# Patient Record
Sex: Male | Born: 1990 | Race: Black or African American | Hispanic: No | Marital: Single | Smoking: Former smoker
Health system: Southern US, Community
[De-identification: ages and names within clinical notes are randomized; demographics above are authoritative.]

## PROBLEM LIST (undated history)

## (undated) ENCOUNTER — Ambulatory Visit: Admission: EM | Payer: Medicaid Other | Source: Home / Self Care

## (undated) DIAGNOSIS — R011 Cardiac murmur, unspecified: Secondary | ICD-10-CM

## (undated) DIAGNOSIS — G43909 Migraine, unspecified, not intractable, without status migrainosus: Secondary | ICD-10-CM

---
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
## 2012-10-24 ENCOUNTER — Emergency Department (HOSPITAL_COMMUNITY): Payer: No Typology Code available for payment source

## 2012-10-24 ENCOUNTER — Emergency Department (HOSPITAL_COMMUNITY)
Admission: EM | Admit: 2012-10-24 | Discharge: 2012-10-25 | Disposition: A | Discharge status: Home / Self Care | Payer: No Typology Code available for payment source | Attending: Emergency Medicine | Admitting: Emergency Medicine

## 2012-10-24 ENCOUNTER — Encounter (HOSPITAL_COMMUNITY): Payer: Self-pay | Admitting: *Deleted

## 2012-10-24 DIAGNOSIS — S298XXA Other specified injuries of thorax, initial encounter: Secondary | ICD-10-CM | POA: Insufficient documentation

## 2012-10-24 DIAGNOSIS — Y92410 Unspecified street and highway as the place of occurrence of the external cause: Secondary | ICD-10-CM | POA: Insufficient documentation

## 2012-10-24 DIAGNOSIS — Y9389 Activity, other specified: Secondary | ICD-10-CM | POA: Insufficient documentation

## 2012-10-24 DIAGNOSIS — R4182 Altered mental status, unspecified: Secondary | ICD-10-CM | POA: Insufficient documentation

## 2012-10-24 DIAGNOSIS — S3981XA Other specified injuries of abdomen, initial encounter: Secondary | ICD-10-CM | POA: Insufficient documentation

## 2012-10-24 MED ORDER — HYDROMORPHONE HCL PF 1 MG/ML IJ SOLN
1.0000 mg | Freq: Once | INTRAMUSCULAR | Status: AC
  Administered 2012-10-24: 1 mg via INTRAVENOUS
  Filled 2012-10-24: qty 1

## 2012-10-24 MED ORDER — SODIUM CHLORIDE 0.9 % IV SOLN
INTRAVENOUS | Status: DC
  Administered 2012-10-24 – 2012-10-25 (×2): via INTRAVENOUS

## 2012-10-24 MED ORDER — ONDANSETRON HCL 4 MG/2ML IJ SOLN
4.0000 mg | Freq: Once | INTRAMUSCULAR | Status: AC
  Administered 2012-10-24: 4 mg via INTRAVENOUS
  Filled 2012-10-24: qty 2

## 2012-10-24 MED ORDER — IOHEXOL 300 MG/ML  SOLN
100.0000 mL | Freq: Once | INTRAMUSCULAR | Status: AC | PRN
  Administered 2012-10-24: 100 mL via INTRAVENOUS
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
## 2012-10-25 MED ORDER — IBUPROFEN 600 MG PO TABS: 600.0000 mg | ORAL_TABLET | Freq: Four times a day (QID) | ORAL | Status: DC | PRN

## 2012-10-25 MED ORDER — MORPHINE SULFATE 4 MG/ML IJ SOLN
6.0000 mg | Freq: Once | INTRAMUSCULAR | Status: AC
  Administered 2012-10-25: 6 mg via INTRAVENOUS
  Filled 2012-10-25: qty 2

## 2012-10-25 MED ORDER — HYDROCODONE-ACETAMINOPHEN 5-325 MG PO TABS: 1.0000 | ORAL_TABLET | ORAL | Status: DC | PRN

## 2012-10-25 MED ORDER — IBUPROFEN 800 MG PO TABS
800.0000 mg | ORAL_TABLET | Freq: Once | ORAL | Status: AC
  Administered 2012-10-25: 800 mg via ORAL
  Filled 2012-10-25: qty 1

## 2012-10-25 MED ORDER — CYCLOBENZAPRINE HCL 10 MG PO TABS: 10.0000 mg | ORAL_TABLET | Freq: Two times a day (BID) | ORAL | Status: DC | PRN

## 2012-10-25 MED ORDER — CYCLOBENZAPRINE HCL 10 MG PO TABS
10.0000 mg | ORAL_TABLET | Freq: Once | ORAL | Status: AC
  Administered 2012-10-25: 10 mg via ORAL
  Filled 2012-10-25: qty 1

## 2012-10-25 NOTE — ED Provider Notes (Signed)
Medical screening examination/treatment/procedure(s) were conducted as a shared visit with non-physician practitioner(s) and myself.  I personally evaluated the patient during the encounter. Patient very anxious with good tenderness but no crepitus and equal lung sounds. Patient evaluated with trauma scans reviewed. Pain improved with IV Dilaudid. Patient ambulates with assistance. His father is bedside and willing to help in the home. Prescriptions provided. Precautions provided. Patient stable for discharge home with outpatient referral. No indication for admission. Confusion resolved without altered mental status.    Jacob Nielsen, MD 10/25/12 [phone]

## 2014-01-16 ENCOUNTER — Emergency Department (INDEPENDENT_AMBULATORY_CARE_PROVIDER_SITE_OTHER)
Admission: EM | Admit: 2014-01-16 | Discharge: 2014-01-16 | Disposition: A | Discharge status: Home / Self Care | Payer: Self-pay | Source: Home / Self Care | Attending: Emergency Medicine | Admitting: Emergency Medicine

## 2014-01-16 ENCOUNTER — Encounter (HOSPITAL_COMMUNITY): Payer: Self-pay | Admitting: Emergency Medicine

## 2014-01-16 ENCOUNTER — Other Ambulatory Visit (HOSPITAL_COMMUNITY)
Admission: RE | Admit: 2014-01-16 | Discharge: 2014-01-16 | Disposition: A | Discharge status: Home / Self Care | Payer: Self-pay | Source: Ambulatory Visit | Attending: Emergency Medicine | Admitting: Emergency Medicine

## 2014-01-16 DIAGNOSIS — Z202 Contact with and (suspected) exposure to infections with a predominantly sexual mode of transmission: Secondary | ICD-10-CM

## 2014-01-16 DIAGNOSIS — Z113 Encounter for screening for infections with a predominantly sexual mode of transmission: Secondary | ICD-10-CM | POA: Insufficient documentation

## 2014-01-16 LAB — RPR: RPR Ser Ql: NONREACTIVE

## 2014-01-16 LAB — HIV ANTIBODY (ROUTINE TESTING W REFLEX): HIV: NONREACTIVE

## 2014-01-16 MED ORDER — CEFTRIAXONE SODIUM 250 MG IJ SOLR
INTRAMUSCULAR | Status: AC
  Filled 2014-01-16: qty 250

## 2014-01-16 MED ORDER — AZITHROMYCIN 250 MG PO TABS
ORAL_TABLET | ORAL | Status: AC
  Filled 2014-01-16: qty 4

## 2014-01-16 MED ORDER — CEFTRIAXONE SODIUM 250 MG IJ SOLR
250.0000 mg | Freq: Once | INTRAMUSCULAR | Status: AC
  Administered 2014-01-16: 250 mg via INTRAMUSCULAR

## 2014-01-16 MED ORDER — AZITHROMYCIN 250 MG PO TABS
1000.0000 mg | ORAL_TABLET | Freq: Once | ORAL | Status: AC
  Administered 2014-01-16: 1000 mg via ORAL

## 2014-01-16 NOTE — ED Notes (Signed)
PT DENYS  ANY  SYMPTOMS  HOWEVER       STATES  CONDOM  BURST     LAST  PM

## 2014-01-16 NOTE — ED Provider Notes (Signed)
CSN: [id number]     Arrival date & time 01/16/14  1104 History   First MD Initiated Contact with Patient 01/16/14 1204     Chief Complaint  Patient presents with  . Exposure to STD   (Consider location/radiation/quality/duration/timing/severity/associated sxs/prior Treatment) HPI Comments: Patient states he is concerned about possible STI exposure. States he had a condom tear during intercourse yesterday. Has been treated in the past for gonorrhea and chlamydia. Denies any symptoms currently.   Patient is a 23 y.o. male presenting with STD exposure. The history is provided by the patient.  Exposure to STD This is a new problem. The current episode started yesterday.    History reviewed. No pertinent past medical history. History reviewed. No pertinent past surgical history. History reviewed. No pertinent family history. History  Substance Use Topics  . Smoking status: Never Smoker   . Smokeless tobacco: Not on file  . Alcohol Use: No    Review of Systems  All other systems reviewed and are negative.    Allergies  Review of patient's allergies indicates no known allergies.  Home Medications   Current Outpatient Rx  Name  Route  Sig  Dispense  Refill  . cyclobenzaprine (FLEXERIL) 10 MG tablet   Oral   Take 1 tablet (10 mg total) by mouth 2 (two) times daily as needed for muscle spasms.   20 tablet   0   . HYDROcodone-acetaminophen (NORCO/VICODIN) 5-325 MG per tablet   Oral   Take 1 tablet by mouth every 4 (four) hours as needed for pain.   15 tablet   0   . ibuprofen (ADVIL,MOTRIN) 200 MG tablet   Oral   Take 200 mg by mouth every 6 (six) hours as needed. For headache or mild pain         . ibuprofen (ADVIL,MOTRIN) 600 MG tablet   Oral   Take 1 tablet (600 mg total) by mouth every 6 (six) hours as needed for pain.   30 tablet   0    There were no vitals taken for this visit. Physical Exam  Constitutional: He is oriented to person, place, and time. He  appears well-developed and well-nourished. No distress.  HENT:  Head: Normocephalic and atraumatic.  Eyes: Conjunctivae are normal. No scleral icterus.  Cardiovascular: Normal rate.   Pulmonary/Chest: Effort normal.  Abdominal: There is no tenderness.  Genitourinary:  Declines exam of genitalia  Musculoskeletal: Normal range of motion.  Neurological: He is alert and oriented to person, place, and time.  Skin: Skin is warm and dry.  Psychiatric: He has a normal mood and affect. His behavior is normal.    ED Course  Procedures (including critical care time) Labs Review Labs Reviewed  HIV ANTIBODY (ROUTINE TESTING)  RPR  URINE CYTOLOGY ANCILLARY ONLY   Imaging Review No results found.   MDM   1. Possible exposure to STD    [facility] and contact by phone if labs indicate need for additional treatment.   Jess BartersJennifer Lee AuroraPresson, [place] 01/16/14 1257

## 2014-01-16 NOTE — ED Provider Notes (Signed)
Medical screening examination/treatment/procedure(s) were performed by non-physician practitioner and as supervising physician I was immediately available for consultation/collaboration.  Leslee Homeavid Onalee Steinbach, M.D.  Reuben Likesavid C Rhythm Gubbels, MD 01/16/14 2120

## 2014-01-17 LAB — URINE CYTOLOGY ANCILLARY ONLY
Chlamydia: POSITIVE — AB
Neisseria Gonorrhea: NEGATIVE
TRICH (WINDOWPATH): NEGATIVE

## 2014-01-18 ENCOUNTER — Telehealth (HOSPITAL_COMMUNITY): Payer: Self-pay | Admitting: *Deleted

## 2014-01-18 NOTE — ED Notes (Signed)
GC/Trich neg. Chlamydia pos.,  HIV/RPR non-reactive.  Pt. adequately treated with Zithromax and Rocephin.  I called pt. Pt. left 2 other numbers to try and both were not working numbers on his VM, [phone] and [phone]. I left a message to call at his cell number. The home number was disconnected also.  Call 1. Jacob Johnston, Jacob Johnston 01/18/2014

## 2014-01-19 NOTE — ED Notes (Signed)
[facility].  Pt. verified x 2 and given results.  Pt. told he was adequately treated with the Zithromax.  Pt. instructed to notify his partner, no sex for 1 week and to practice safe sex. Pt. told he should get [facility], by appointment.  He said he did not wait 1 week.  I told him he should notify his partner and get rechecked if any further symptoms.  [facility] form completed [facility]. Desiree LucySuzanne M [facility] 01/19/2014

## 2014-02-28 ENCOUNTER — Encounter (HOSPITAL_COMMUNITY): Payer: Self-pay | Admitting: Emergency Medicine

## 2014-02-28 ENCOUNTER — Emergency Department (INDEPENDENT_AMBULATORY_CARE_PROVIDER_SITE_OTHER)
Admission: EM | Admit: 2014-02-28 | Discharge: 2014-02-28 | Disposition: A | Discharge status: Home / Self Care | Payer: Self-pay | Source: Home / Self Care | Attending: Family Medicine | Admitting: Family Medicine

## 2014-02-28 ENCOUNTER — Other Ambulatory Visit (HOSPITAL_COMMUNITY)
Admission: RE | Admit: 2014-02-28 | Discharge: 2014-02-28 | Disposition: A | Discharge status: Home / Self Care | Payer: Self-pay | Source: Ambulatory Visit | Attending: Family Medicine | Admitting: Family Medicine

## 2014-02-28 DIAGNOSIS — Z113 Encounter for screening for infections with a predominantly sexual mode of transmission: Secondary | ICD-10-CM

## 2014-02-28 NOTE — ED Notes (Signed)
HERE FOR STD CHECKING
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
## 2014-02-28 NOTE — Discharge Instructions (Signed)
We will call with positive test results and treat as indicated  °

## 2014-03-03 ENCOUNTER — Telehealth (HOSPITAL_COMMUNITY): Payer: Self-pay | Admitting: Family Medicine

## 2014-03-03 MED ORDER — AZITHROMYCIN 250 MG PO TABS: 1000.0000 mg | ORAL_TABLET | Freq: Once | ORAL | Status: DC

## 2014-03-03 NOTE — ED Notes (Signed)
Chlamydia positive. Azithromycin Prescriptions  sent to pharymacy  Rodolph BongEvan S Tawsha Terrero, MD 03/03/14 1536

## 2014-03-09 ENCOUNTER — Emergency Department (HOSPITAL_COMMUNITY): Payer: No Typology Code available for payment source

## 2014-03-09 ENCOUNTER — Emergency Department (HOSPITAL_COMMUNITY)
Admission: EM | Admit: 2014-03-09 | Discharge: 2014-03-09 | Disposition: A | Discharge status: Home / Self Care | Payer: No Typology Code available for payment source | Attending: Emergency Medicine | Admitting: Emergency Medicine

## 2014-03-09 ENCOUNTER — Encounter (HOSPITAL_COMMUNITY): Payer: Self-pay | Admitting: Emergency Medicine

## 2014-03-09 DIAGNOSIS — Z792 Long term (current) use of antibiotics: Secondary | ICD-10-CM | POA: Insufficient documentation

## 2014-03-09 DIAGNOSIS — Y9389 Activity, other specified: Secondary | ICD-10-CM | POA: Insufficient documentation

## 2014-03-09 DIAGNOSIS — Y92410 Unspecified street and highway as the place of occurrence of the external cause: Secondary | ICD-10-CM | POA: Insufficient documentation

## 2014-03-09 DIAGNOSIS — S161XXA Strain of muscle, fascia and tendon at neck level, initial encounter: Secondary | ICD-10-CM

## 2014-03-09 DIAGNOSIS — S139XXA Sprain of joints and ligaments of unspecified parts of neck, initial encounter: Secondary | ICD-10-CM | POA: Insufficient documentation

## 2014-03-09 DIAGNOSIS — IMO0002 Reserved for concepts with insufficient information to code with codable children: Secondary | ICD-10-CM | POA: Insufficient documentation

## 2014-03-09 MED ORDER — HYDROCODONE-ACETAMINOPHEN 5-325 MG PO TABS
1.0000 | ORAL_TABLET | Freq: Once | ORAL | Status: AC
  Administered 2014-03-09: 1 via ORAL
  Filled 2014-03-09: qty 1

## 2014-03-09 MED ORDER — METHOCARBAMOL 500 MG PO TABS
1000.0000 mg | ORAL_TABLET | Freq: Once | ORAL | Status: AC
  Administered 2014-03-09: 1000 mg via ORAL
  Filled 2014-03-09: qty 2

## 2014-03-09 MED ORDER — IBUPROFEN 800 MG PO TABS: 800.0000 mg | ORAL_TABLET | Freq: Three times a day (TID) | ORAL | Status: DC

## 2014-03-09 MED ORDER — HYDROCODONE-ACETAMINOPHEN 5-325 MG PO TABS: 2.0000 | ORAL_TABLET | ORAL | Status: DC | PRN

## 2014-03-09 MED ORDER — METHOCARBAMOL 500 MG PO TABS: 500.0000 mg | ORAL_TABLET | Freq: Two times a day (BID) | ORAL | Status: DC

## 2014-03-09 NOTE — Progress Notes (Signed)
[facility] provided pt with a list of primary care resources to help patient establish a pcp.  °

## 2014-03-09 NOTE — ED Notes (Signed)
Patient transported to X-ray 
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
## 2014-03-09 NOTE — ED Notes (Signed)
Bed: WA16 Expected date:  Expected time:  Means of arrival:  Comments: ems 

## 2014-03-09 NOTE — Discharge Instructions (Signed)
Cervical Sprain °A cervical sprain is when the tissues (ligaments) that hold the neck bones in place stretch or tear. °HOME CARE  °· Put ice on the injured area. °· Put ice in a plastic bag. °· Place a towel between your skin and the bag. °· Leave the ice on for 15 20 minutes, 3 4 times a day. °· You may have been given a collar to wear. This collar keeps your neck from moving while you heal. °· Do not take the collar off unless told by your doctor. °· If you have long hair, keep it outside of the collar. °· Ask your doctor before changing the position of your collar. You may need to change its position over time to make it more comfortable. °· If you are allowed to take off the collar for cleaning or bathing, follow your doctor's instructions on how to do it safely. °· Keep your collar clean by wiping it with mild soap and water. Dry it completely. If the collar has removable pads, remove them every 1 2 days to hand wash them with soap and water. Allow them to air dry. They should be dry before you wear them in the collar. °· Do not drive while wearing the collar. °· Only take medicine as told by your doctor. °· Keep all doctor visits as told. °· Keep all physical therapy visits as told. °· Adjust your work station so that you have good posture while you work. °· Avoid positions and activities that make your problems worse. °· Warm up and stretch before being active. °GET HELP IF: °· Your pain is not controlled with medicine. °· You cannot take less pain medicine over time as planned. °· Your activity level does not improve as expected. °GET HELP RIGHT AWAY IF:  °· You are bleeding. °· Your stomach is upset. °· You have an allergic reaction to your medicine. °· You develop new problems that you cannot explain. °· You lose feeling (become numb) or you cannot move any part of your body (paralysis). °· You have tingling or weakness in any part of your body. °· Your symptoms get worse. Symptoms include: °· Pain,  soreness, stiffness, puffiness (swelling), or a burning feeling in your neck. °· Pain when your neck is touched. °· Shoulder or upper back pain. °· Limited ability to move your neck. °· Headache. °· Dizziness. °· Your hands or arms feel week, lose feeling, or tingle. °· Muscle spasms. °· Difficulty swallowing or chewing. °MAKE SURE YOU:  °· Understand these instructions. °· Will watch your condition. °· Will get help right away if you are not doing well or get worse. °Document Released: 03/18/2008 Document Revised: 06/02/2013 Document Reviewed: 04/07/2013 °[employer]® Patient Information ©2014 [employer]. ° °

## 2014-03-09 NOTE — ED Notes (Addendum)
EMS report: Cervical collar in place.  Back cleared at scene.  Ambulatory and smoking cigarette under tree on EMS arrival.  Vitals:  110/90, hr 90, resp 20

## 2014-06-24 ENCOUNTER — Encounter (HOSPITAL_COMMUNITY): Payer: Self-pay | Admitting: Emergency Medicine

## 2014-06-24 ENCOUNTER — Emergency Department (HOSPITAL_COMMUNITY)
Admission: EM | Admit: 2014-06-24 | Discharge: 2014-06-24 | Disposition: A | Discharge status: Home / Self Care | Payer: No Typology Code available for payment source | Attending: Emergency Medicine | Admitting: Emergency Medicine

## 2014-06-24 DIAGNOSIS — F17200 Nicotine dependence, unspecified, uncomplicated: Secondary | ICD-10-CM | POA: Insufficient documentation

## 2014-06-24 DIAGNOSIS — Z792 Long term (current) use of antibiotics: Secondary | ICD-10-CM | POA: Insufficient documentation

## 2014-06-24 DIAGNOSIS — R369 Urethral discharge, unspecified: Secondary | ICD-10-CM

## 2014-06-24 DIAGNOSIS — Z79899 Other long term (current) drug therapy: Secondary | ICD-10-CM | POA: Insufficient documentation

## 2014-06-24 DIAGNOSIS — Z791 Long term (current) use of non-steroidal anti-inflammatories (NSAID): Secondary | ICD-10-CM | POA: Insufficient documentation

## 2014-06-24 MED ORDER — AZITHROMYCIN 250 MG PO TABS
1000.0000 mg | ORAL_TABLET | Freq: Once | ORAL | Status: AC
  Administered 2014-06-24: 1000 mg via ORAL
  Filled 2014-06-24: qty 4

## 2014-06-24 MED ORDER — CEFTRIAXONE SODIUM 250 MG IJ SOLR
250.0000 mg | Freq: Once | INTRAMUSCULAR | Status: AC
  Administered 2014-06-24: 250 mg via INTRAMUSCULAR
  Filled 2014-06-24: qty 250

## 2014-06-24 NOTE — ED Provider Notes (Signed)
CSN: [id number]     Arrival date & time 06/24/14  1237 History  This chart was scribed for Jacob Johnston, working with Purvis Sheffield, MD found by Elon Spanner, ED Scribe. This patient was seen in room WTR8/WTR8 and the patient's care was started at 1:53 PM.   Chief Complaint  Patient presents with  . Penile Discharge   Patient is a 23 y.o. male presenting with penile discharge. The history is provided by the patient. No language interpreter was used.  Penile Discharge Pertinent negatives include no chest pain and no shortness of breath.   HPI Comments: Jacob Johnston is a 23 y.o. male who presents to the Emergency Department complaining of penile discharge onset 3 days ago.  Patient reports he had sex last night and is curious to know if he has an STD so he can alert his partner.  Patient denies any associated symptoms.  Patient denies aggravating or alleviating factors.  NKA   History reviewed. No pertinent past medical history. History reviewed. No pertinent past surgical history. No family history on file. History  Substance Use Topics  . Smoking status: Current Every Day Smoker    Types: Cigarettes  . Smokeless tobacco: Not on file  . Alcohol Use: Yes    Review of Systems  Constitutional: Negative for fever and chills.  Respiratory: Negative for shortness of breath.   Cardiovascular: Negative for chest pain.  Gastrointestinal: Negative for nausea, vomiting, diarrhea and constipation.  Genitourinary: Positive for discharge. Negative for dysuria and penile pain.      Allergies  Review of patient's allergies indicates no known allergies.  Home Medications   Prior to Admission medications   Medication Sig Start Date End Date Taking? Authorizing Provider  azithromycin (ZITHROMAX) 250 MG tablet Take 4 tablets (1,000 mg total) by mouth once. 03/03/14   Rodolph Bong, MD  HYDROcodone-acetaminophen (NORCO/VICODIN) 5-325 MG per tablet Take 2 tablets by mouth every 4 (four)  hours as needed. 03/09/14   Rolland Porter, MD  ibuprofen (ADVIL,MOTRIN) 800 MG tablet Take 1 tablet (800 mg total) by mouth 3 (three) times daily. 03/09/14   Rolland Porter, MD  methocarbamol (ROBAXIN) 500 MG tablet Take 1 tablet (500 mg total) by mouth 2 (two) times daily. 03/09/14   Rolland Porter, MD   BP 121/53  Pulse 56  Temp(Src) 98.4 F (36.9 C) (Oral)  Resp 18  SpO2 99% Physical Exam  Nursing note and vitals reviewed. Constitutional: He is oriented to person, place, and time. He appears well-developed and well-nourished. No distress.  HENT:  Head: Normocephalic and atraumatic.  Eyes: Conjunctivae and EOM are normal.  Neck: Neck supple. No tracheal deviation present.  Cardiovascular: Normal rate.   Pulmonary/Chest: Effort normal. No respiratory distress.  Genitourinary:  Chaperone present during exam.  Circumcised male, yellow penile discharge, no abnormality about the shaft, testicles or scrotum.   Musculoskeletal: Normal range of motion.  Neurological: He is alert and oriented to person, place, and time.  Skin: Skin is warm and dry.  Psychiatric: He has a normal mood and affect. His behavior is normal.    ED Course  Procedures (including critical care time)  DIAGNOSTIC STUDIES: Oxygen Saturation is 99% on RA, normal by my interpretation.    COORDINATION OF CARE:  1:56 PM Discussed treatment plan with patient at bedside.  Patient acknowledges and agrees with plan.    Labs Review Labs Reviewed - No data to display  Imaging Review No results found.   EKG Interpretation None  MDM   Final diagnoses:  Penile discharge    Patient with penile discharge. Will treat with Rocephin and azithromycin. Recommend PCP followup.  I personally performed the services described in this documentation, which was scribed in my presence. The recorded information has been reviewed and is accurate.    Jacob Horseman, PA-C 06/24/14 1443

## 2014-06-24 NOTE — ED Notes (Signed)
Pt c/o penile discharge x 3 days. Pt been having unprotected sex.

## 2014-06-24 NOTE — ED Provider Notes (Signed)
Medical screening examination/treatment/procedure(s) were performed by non-physician practitioner and as supervising physician I was immediately available for consultation/collaboration.   EKG Interpretation None        Purvis Sheffield, MD 06/24/14 1606

## 2014-06-24 NOTE — Progress Notes (Signed)
[facility] Community Liaison Stacy, ° °Provided pt with a list of primary care resources to help patient establish a pcp.  °

## 2014-06-24 NOTE — Discharge Instructions (Signed)

## 2014-06-25 LAB — GC/CHLAMYDIA PROBE AMP
CT Probe RNA: NEGATIVE
GC Probe RNA: POSITIVE — AB

## 2014-06-26 ENCOUNTER — Telehealth (HOSPITAL_BASED_OUTPATIENT_CLINIC_OR_DEPARTMENT_OTHER): Payer: Self-pay | Admitting: Emergency Medicine

## 2014-06-26 NOTE — Telephone Encounter (Signed)
Positive Gonorrhea culture Treated with Rocephin and Zithromax [facility] faxed Will contact patient  06/26/14 @ 1228 Left voicemail to return call to flow managers #

## 2014-06-27 ENCOUNTER — Telehealth (HOSPITAL_COMMUNITY): Payer: Self-pay

## 2015-06-10 ENCOUNTER — Emergency Department (HOSPITAL_COMMUNITY): Payer: Self-pay

## 2015-06-10 ENCOUNTER — Encounter (HOSPITAL_COMMUNITY): Payer: Self-pay | Admitting: Emergency Medicine

## 2015-06-10 ENCOUNTER — Emergency Department (HOSPITAL_COMMUNITY)
Admission: EM | Admit: 2015-06-10 | Discharge: 2015-06-10 | Discharge status: Against Medical Advice | Payer: Self-pay | Attending: Emergency Medicine | Admitting: Emergency Medicine

## 2015-06-10 DIAGNOSIS — R1110 Vomiting, unspecified: Secondary | ICD-10-CM | POA: Insufficient documentation

## 2015-06-10 DIAGNOSIS — Z720 Tobacco use: Secondary | ICD-10-CM | POA: Insufficient documentation

## 2015-06-10 DIAGNOSIS — R197 Diarrhea, unspecified: Secondary | ICD-10-CM | POA: Insufficient documentation

## 2015-06-10 DIAGNOSIS — R011 Cardiac murmur, unspecified: Secondary | ICD-10-CM | POA: Insufficient documentation

## 2015-06-10 HISTORY — DX: Migraine, unspecified, not intractable, without status migrainosus: G43.909

## 2015-06-10 HISTORY — DX: Cardiac murmur, unspecified: R01.1

## 2015-06-10 LAB — BASIC METABOLIC PANEL
ANION GAP: 3 — AB (ref 5–15)
BUN: 9 mg/dL (ref 6–20)
CO2: 33 mmol/L — AB (ref 22–32)
Calcium: 9.7 mg/dL (ref 8.9–10.3)
Chloride: 105 mmol/L (ref 101–111)
Creatinine, Ser: 1.22 mg/dL (ref 0.61–1.24)
GFR calc Af Amer: >60 mL/min (ref >60)
GLUCOSE: 86 mg/dL (ref 65–99)
Potassium: 4.1 mmol/L (ref 3.5–5.1)
Sodium: 141 mmol/L (ref 135–145)

## 2015-06-10 LAB — I-STAT TROPONIN, ED: Troponin i, poc: 0 ng/mL (ref 0.00–0.08)

## 2015-06-10 NOTE — ED Notes (Signed)
Pt states that his heart murmur gotten worse over the past 3 days. Pt states the feels like it stops or something. Pt denies chest pain. Pt states that he needs to know how long he is going to have to be here bc he needs to go get his check. After informing pt that atleast 3-4 hours. He asked if he could get a work note for being here and leave now, informed pt that he would have to be seen by provider before he could get a work note.  Pt states that he will wait to be seen for the note.

## 2015-06-12 ENCOUNTER — Emergency Department (HOSPITAL_COMMUNITY): Payer: Self-pay

## 2015-06-12 ENCOUNTER — Emergency Department (HOSPITAL_COMMUNITY)
Admission: EM | Admit: 2015-06-12 | Discharge: 2015-06-12 | Disposition: A | Discharge status: Home / Self Care | Payer: Self-pay | Attending: Emergency Medicine | Admitting: Emergency Medicine

## 2015-06-12 ENCOUNTER — Encounter (HOSPITAL_COMMUNITY): Payer: Self-pay

## 2015-06-12 DIAGNOSIS — Z8679 Personal history of other diseases of the circulatory system: Secondary | ICD-10-CM | POA: Insufficient documentation

## 2015-06-12 DIAGNOSIS — R011 Cardiac murmur, unspecified: Secondary | ICD-10-CM | POA: Insufficient documentation

## 2015-06-12 DIAGNOSIS — Z720 Tobacco use: Secondary | ICD-10-CM | POA: Insufficient documentation

## 2015-06-12 DIAGNOSIS — R079 Chest pain, unspecified: Secondary | ICD-10-CM | POA: Insufficient documentation

## 2015-06-12 DIAGNOSIS — R0602 Shortness of breath: Secondary | ICD-10-CM | POA: Insufficient documentation

## 2015-06-12 DIAGNOSIS — Z79899 Other long term (current) drug therapy: Secondary | ICD-10-CM | POA: Insufficient documentation

## 2015-06-12 LAB — CBC
HEMATOCRIT: 37.6 % — AB (ref 39.0–52.0)
HEMOGLOBIN: 12.6 g/dL — AB (ref 13.0–17.0)
MCH: 30.9 pg (ref 26.0–34.0)
MCHC: 33.5 g/dL (ref 30.0–36.0)
MCV: 92.2 fL (ref 78.0–100.0)
Platelets: 126 10*3/uL — ABNORMAL LOW (ref 150–400)
RBC: 4.08 MIL/uL — ABNORMAL LOW (ref 4.22–5.81)
RDW: 13.2 % (ref 11.5–15.5)
WBC: 3 10*3/uL — AB (ref 4.0–10.5)

## 2015-06-12 LAB — I-STAT TROPONIN, ED: Troponin i, poc: 0 ng/mL (ref 0.00–0.08)

## 2015-06-12 LAB — BASIC METABOLIC PANEL
ANION GAP: 5 (ref 5–15)
BUN: 9 mg/dL (ref 6–20)
CO2: 27 mmol/L (ref 22–32)
Calcium: 9.1 mg/dL (ref 8.9–10.3)
Chloride: 105 mmol/L (ref 101–111)
Creatinine, Ser: 1.32 mg/dL — ABNORMAL HIGH (ref 0.61–1.24)
GFR calc Af Amer: >60 mL/min (ref >60)
Glucose, Bld: 94 mg/dL (ref 65–99)
POTASSIUM: 4 mmol/L (ref 3.5–5.1)
SODIUM: 137 mmol/L (ref 135–145)

## 2015-06-12 NOTE — ED Provider Notes (Signed)
CSN: [id number]     Arrival date & time 06/12/15  0622 History   First MD Initiated Contact with Patient 06/12/15 0636     Chief Complaint  Patient presents with  . Palpitations     (Consider location/radiation/quality/duration/timing/severity/associated sxs/prior Treatment) The history is provided by the patient and medical records.    This is a 24 year old male with history of heart murmur, migraines, presenting to the ED for chest pain. Patient states for the past 2 days he has been having persistent chest pain. He states pain is described as a "stinging sensation" all across his chest. He states he has long history of this-- can occur at rest or with exertion. He states he has had shortness of breath for the past month. He denies any palpitations, dizziness, weakness, nausea, or vomiting.  No family history of sudden cardiac death, MI, or coronary artery disease. He states his brother has a heart murmur as well. Patient is a daily smoker. He has never been evaluated by cardiology for his heart murmur.  He states he was told it would close up at 20, however he continues having problems.  VSS.  Past Medical History  Diagnosis Date  . Heart murmur   . Migraines    History reviewed. No pertinent past surgical history. No family history on file. Social History  Substance Use Topics  . Smoking status: Current Every Day Smoker -- 0.50 packs/day    Types: Cigarettes  . Smokeless tobacco: None  . Alcohol Use: Yes    Review of Systems  Respiratory: Positive for shortness of breath.   Cardiovascular: Positive for chest pain.  All other systems reviewed and are negative.     Allergies  Review of patient's allergies indicates no known allergies.  Home Medications   Prior to Admission medications   Medication Sig Start Date End Date Taking? Authorizing Provider  azithromycin (ZITHROMAX) 250 MG tablet Take 4 tablets (1,000 mg total) by mouth once. 03/03/14   Rodolph Bong, MD   HYDROcodone-acetaminophen (NORCO/VICODIN) 5-325 MG per tablet Take 2 tablets by mouth every 4 (four) hours as needed. 03/09/14   Rolland Porter, MD  ibuprofen (ADVIL,MOTRIN) 800 MG tablet Take 1 tablet (800 mg total) by mouth 3 (three) times daily. 03/09/14   Rolland Porter, MD  methocarbamol (ROBAXIN) 500 MG tablet Take 1 tablet (500 mg total) by mouth 2 (two) times daily. 03/09/14   Rolland Porter, MD   BP 107/66 mmHg  Pulse 44  Temp(Src) 97.6 F (36.4 C) (Oral)  Resp 16  SpO2 100%   Physical Exam  Constitutional: He is oriented to person, place, and time. He appears well-developed and well-nourished. No distress.  Eating breakfast burrito  HENT:  Head: Normocephalic and atraumatic.  Mouth/Throat: Oropharynx is clear and moist.  Eyes: Conjunctivae and EOM are normal. Pupils are equal, round, and reactive to light.  Neck: Normal range of motion. Neck supple.  Cardiovascular: Normal rate and regular rhythm.   Murmur heard. Faint murmur noted  Pulmonary/Chest: Effort normal and breath sounds normal. No respiratory distress. He has no wheezes.  Abdominal: Soft. Bowel sounds are normal. There is no tenderness. There is no guarding.  Musculoskeletal: Normal range of motion.  Neurological: He is alert and oriented to person, place, and time.  Skin: Skin is warm and dry. He is not diaphoretic.  Psychiatric: He has a normal mood and affect.  Nursing note and vitals reviewed.   ED Course  Procedures (including critical care time) Labs Review Labs  Reviewed  CBC - Abnormal; Notable for the following:    WBC 3.0 (*)    RBC 4.08 (*)    Hemoglobin 12.6 (*)    HCT 37.6 (*)    Platelets 126 (*)    All other components within normal limits  BASIC METABOLIC PANEL - Abnormal; Notable for the following:    Creatinine, Ser 1.32 (*)    All other components within normal limits  Rosezena Sensor, ED    Imaging Review Dg Chest 2 View  06/10/2015   CLINICAL DATA:  Heart murmur.  Smoker.  EXAM: CHEST  2  VIEW  COMPARISON:  Chest CT 10/24/2012  FINDINGS: The heart size and mediastinal contours are within normal limits. Both lungs are clear. The visualized skeletal structures are unremarkable.  IMPRESSION: No active cardiopulmonary disease.   Electronically Signed   By: Charlett Nose M.D.   On: 06/10/2015 14:57   I have personally reviewed and evaluated these images and lab results as part of my medical decision-making.   EKG Interpretation None      MDM   Final diagnoses:  Chest pain, unspecified chest pain type   24 year old male here with 2 days of persistent chest pain and shortness of breath. He has history of heart murmur. Has never had cardiology evaluation.  EKG NSR with early repolarization, no acute ischemia noted.  labwork is reassuring, troponin negative. Chest x-ray is clear.  Patient has very faint murmor noted on exam, this may be benign.  No family history of sudden cardiac death.  Given negative work-up here today after 2 days of ongoing chest pain, low suspicion for ACS, PE, dissection, or other acute cardiac event at this time.  Patient is PERC negative.  Patient will be referred to cardiology for further evaluation.  Discussed plan with patient, he/she acknowledged understanding and agreed with plan of care.  Return precautions given for new or worsening symptoms.  Garlon Hatchet, PA-C 06/12/15 4098  Loren Racer, MD 06/13/15 [phone]

## 2015-06-12 NOTE — Discharge Instructions (Signed)
Follow-up with cardiology-- call to make appt. Return to the ED for new or worsening symptoms.   Emergency Department Resource Guide 1) Find a Doctor and Pay Out of Pocket Although you won't have to find out who is covered by your insurance plan, it is a good idea to ask around and get recommendations. You will then need to call the office and see if the doctor you have chosen will accept you as a new patient and what types of options they offer for patients who are self-pay. Some doctors offer discounts or will set up payment plans for their patients who do not have insurance, but you will need to ask so you aren't surprised when you get to your appointment.  2) Contact Your Local Health Department Not all health departments have doctors that can see patients for sick visits, but many do, so it is worth a call to see if yours does. If you don't know where your local health department is, you can check in your phone book. The CDC also has a tool to help you locate your state's health department, and many state websites also have listings of all of their local health departments.  3) Find a Walk-in Clinic If your illness is not likely to be very severe or complicated, you may want to try a walk in clinic. These are popping up all over the country in pharmacies, drugstores, and shopping centers. They're usually staffed by nurse practitioners or physician assistants that have been trained to treat common illnesses and complaints. They're usually fairly quick and inexpensive. However, if you have serious medical issues or chronic medical problems, these are probably not your best option.  No Primary Care Doctor: - Call Health Connect at  [phone] - they can help you locate a primary care doctor that  accepts your insurance, provides certain services, etc. - Physician Referral Service- [phone]  Chronic Pain Problems: Organization         Address  Phone   Notes  [facility]   [phone] Patients need to be referred by their primary care doctor.   Medication Assistance: Organization         Address  Phone   Notes  [facility] Medication [facility] [address] [place] [place], [place] [address] [phone] --Must be a resident of [facility] -- Must have NO insurance coverage whatsoever (no Medicaid/ Medicare, etc.) -- The pt. MUST have a primary care doctor that directs their care regularly and follows them in the community   [facility]  [phone]   [employer]  [phone]    Agencies that provide inexpensive medical care: Organization         Address  Phone   Notes  [facility]  [phone]   [facility]    [phone]   [facility] [address] [place], [place] [address] [phone]   [facility] [address] [place], [place] [phone]   Planned Parenthood    [phone]   [facility]    [phone]   [facility]  [address], [place] Phone:  [phone], Fax:  [phone] Hours of Operation:  9 am - 6 pm, M-F.  Also accepts Medicaid/Medicare and self-pay.  [facility]  [address], [place] Phone: [phone], Fax: [phone]. Hours of Operation:  8:30 am - 5:30 pm, M-F.  Also accepts Medicaid and self-pay.  [facility] [address] [place] Phone: [phone]   [place], [place], [place] [phone], Ext. [phone] Mondays & Thursdays: 7-9 AM.  First 15 patients are seen on a first come, first serve basis.    [place] Providers:  Organization         Address  Phone   Notes  [facility] [address], [place] [phone] Also accepts self-pay patients.  [facility] [address], [place]  [phone]   [place], [address], [place] [phone]   [facility] [address], [place] [phone]   Lucianne Lei [address], [place]   [phone] Only accepts [place] Access [place] patients after they have their name applied to their card.   Self-Pay (no insurance) in [facility]:  Organization         Address  Phone   Notes  Sickle Cell Patients, [facility] Internal Medicine [place] [phone]   [facility] Urgent Care [place] [phone]   [facility] [place]  [place], [place], [employer] [phone]   [facility]/Dr. Osei-Bonsu  [address] [place] or [address], [place] [phone] Phone number for both [place] and [place] locations is the same.  [facility]gent [facility] and [facility] [address], [place] [phone]   [facility] [address] [place] or [address] [phone]   [facility] [address] [place] [phone], phone; [phone], fax Sees patients 1st and 3rd Saturday of every month.  Must not qualify for public or private insurance (i.e. Medicaid, Medicare, [place] Health Choice, Veterans' Benefits)  Household income should be no more than 200% of the poverty level The clinic cannot treat you if you are pregnant or think you are pregnant  Sexually transmitted diseases are not treated at the clinic.    Dental Care: Organization         Address  Phone  Notes  [facility] Department of [facility] [place] [phone] Accepts children up to age 10 who are enrolled in [place] or [place]; pregnant women with a Medicaid card; and children who have applied for Medicaid or [place] Health  Choice, but were declined, whose parents can pay a reduced fee at time of service.  [facility] Department of [facility]  [address], [place] [phone] Accepts children up to age 32 who are enrolled in [place] or [place]; pregnant women with a Medicaid card; and children who have applied for Medicaid or [place] Health Choice, but were declined, whose parents can pay a reduced fee at time of service.  [place] Adult [facility] Access PRO[facility]  [place] [phone] Patients are seen by appointment only. Walk-ins are not accepted. [place] will see patients 78 years of age and older. Monday - Tuesday (8am-5pm) Most Wednesdays (8:30-5pm) $30 per visit, cash only  [facility] Adult Dental Access PROGRAM  [address], [facility] [phone] Patients are seen by appointment  only. Walk-ins are not accepted. [place] will see patients 69 years of age and older. One Wednesday Evening (Monthly: Volunteer Based).  $30 per visit, cash only  [place]  [phone] for adults; Children under age 5, call Graduate Pediatric Dentistry at [phone]. Children aged 58-14, please call [phone] to request a pediatric application.  Dental services are provided in all areas of dental care including fillings, crowns and bridges, complete and partial dentures, implants, gum treatment, root canals, and extractions. Preventive care is also provided. Treatment is provided to both adults and children. Patients are selected via a lottery and there is often a waiting list.   [facility] [address], [place]  [phone] [email or web address]   [facility] [address] [place], [place] [phone], Ext. [phone] Second and Fourth Thursday of each month, opens at 6:30 AM; Clinic ends at 9 AM.  Patients are seen on a first-come first-served basis, and a limited number are seen during each  clinic.   [facility]  [address] Hillard Danker [place], [place] [phone]   Eligibility Requirements You must have lived in [place], [place], or [place] counties for at least the last three months.   You cannot be eligible for state or federal sponsored [employer], including [employer], [place], or [employer].   You generally cannot be eligible for healthcare insurance through your employer.    How to apply: Eligibility screenings are held every Tuesday and Wednesday afternoon from 1:00 pm until 4:00 pm. You do not need an appointment for the interview!  [facility] [address] [place], [place]   [place]  [place] Department  [place]  [phone]    Behavioral Health Resources in the Community: Intensive Outpatient Programs Organization         Address  Phone  Notes  [place]. [address] [place], [place] [phone]   [facility] Outpatient [address], [place], [place]   ADS: [facility] [address] [place], [place]   [place] [address]  [place], [place] or [phone]   Substance Abuse Resources Organization         Address  Phone  Notes  Al[facility] Drug Services  [phone]   [place]  8025057661   [facility] [place]   Chinita Pester  [phone]   Residential & Outpatient Substance Abuse Program  [phone]   Psychological Services Organization         Address  Phone  Notes  [facility] [place]  [place]  [phone]   [place] [address] [place] or [phone]    Mobile Crisis Teams Organization         Address  Phone  Notes  [facility], Mobile  Crisis Care Unit  [phone]   [facility]  [address] [place], [place]   [place] [address] [place] [phone]    Self-Help/Support Groups Organization         Address  Phone             Notes  [place]. of  - variety of support groups  [place] Call for more information  Narcotics Anonymous (NA), [facility] [address], [employer] [place]  2 meetings at this location   Special educational needs teacher         Address  Phone  Notes  [facility]idential [facility] [place],    [place]  [phone]   [facility]  [address] [place] [address], [place], [place]   [place], [place] [phone] Admissions: 8am-3pm M-F  Incentives Substance [place] [address]    [place], [place] [phone]   [facility] [address] [place], [place], [place]   [facility] [address],  [place], [place]   [facility] - Intensive Outpatient [address] Kristeen [address], [place], [place]   [facility] ([place].,  [place], [place] [phone] or [phone]   [facility] ([facility]) [address] [place], [place] Accepts Medicaid  [facility] [place] [address],  [place] [phone] Substance Abuse/Addiction Treatment   [facility] Organization         Address  Phone  Notes  [facility]  [phone]   Domenic Schwab, PhD [address] Arlis Porta [place], [place]   [phone] or [phone]   [facility] [place], [place] [phone]   [facility] [address], [place], [place] [phone] Insurance/Medicaid/sponsorship through [facility] and Families [address]  [place]                                    [place], [place] [phone] [place] [address], [place] [phone]    Dr. Adele Schilder  [phone]   [facility] Dept. 1) [address], [place] 2) [place] 3)  [place] [address], [place] [phone]  [phone]   [place] [phone] or [phone] (After Hours)

## 2015-06-12 NOTE — ED Notes (Signed)
Pt arrived by pov. Pt states that around 0430 this morning "my heart murmur started acting up". Pt states that he began having chest pain for 2 days but "it didn't go away like usual." Pt states that he has been sob x 1 month. Pt [facility] but didn't stay for whole visit because he had to go to work.

## 2015-09-28 ENCOUNTER — Emergency Department (HOSPITAL_COMMUNITY): Payer: No Typology Code available for payment source

## 2015-09-28 ENCOUNTER — Emergency Department (HOSPITAL_COMMUNITY)
Admission: EM | Admit: 2015-09-28 | Discharge: 2015-09-28 | Disposition: A | Discharge status: Home / Self Care | Payer: No Typology Code available for payment source | Attending: Emergency Medicine | Admitting: Emergency Medicine

## 2015-09-28 ENCOUNTER — Encounter (HOSPITAL_COMMUNITY): Payer: Self-pay | Admitting: *Deleted

## 2015-09-28 DIAGNOSIS — F1510 Other stimulant abuse, uncomplicated: Secondary | ICD-10-CM | POA: Insufficient documentation

## 2015-09-28 DIAGNOSIS — F17210 Nicotine dependence, cigarettes, uncomplicated: Secondary | ICD-10-CM | POA: Diagnosis not present

## 2015-09-28 DIAGNOSIS — R011 Cardiac murmur, unspecified: Secondary | ICD-10-CM | POA: Insufficient documentation

## 2015-09-28 DIAGNOSIS — F1010 Alcohol abuse, uncomplicated: Secondary | ICD-10-CM | POA: Diagnosis not present

## 2015-09-28 DIAGNOSIS — S0081XA Abrasion of other part of head, initial encounter: Secondary | ICD-10-CM | POA: Diagnosis not present

## 2015-09-28 DIAGNOSIS — S32009A Unspecified fracture of unspecified lumbar vertebra, initial encounter for closed fracture: Secondary | ICD-10-CM

## 2015-09-28 DIAGNOSIS — Z23 Encounter for immunization: Secondary | ICD-10-CM | POA: Insufficient documentation

## 2015-09-28 DIAGNOSIS — S32018A Other fracture of first lumbar vertebra, initial encounter for closed fracture: Secondary | ICD-10-CM | POA: Insufficient documentation

## 2015-09-28 DIAGNOSIS — R451 Restlessness and agitation: Secondary | ICD-10-CM | POA: Diagnosis not present

## 2015-09-28 DIAGNOSIS — Z791 Long term (current) use of non-steroidal anti-inflammatories (NSAID): Secondary | ICD-10-CM | POA: Insufficient documentation

## 2015-09-28 DIAGNOSIS — S3992XA Unspecified injury of lower back, initial encounter: Secondary | ICD-10-CM | POA: Diagnosis present

## 2015-09-28 DIAGNOSIS — S32038A Other fracture of third lumbar vertebra, initial encounter for closed fracture: Secondary | ICD-10-CM | POA: Diagnosis not present

## 2015-09-28 DIAGNOSIS — Y9389 Activity, other specified: Secondary | ICD-10-CM | POA: Diagnosis not present

## 2015-09-28 DIAGNOSIS — Y92410 Unspecified street and highway as the place of occurrence of the external cause: Secondary | ICD-10-CM | POA: Diagnosis not present

## 2015-09-28 DIAGNOSIS — S32048A Other fracture of fourth lumbar vertebra, initial encounter for closed fracture: Secondary | ICD-10-CM | POA: Diagnosis not present

## 2015-09-28 DIAGNOSIS — N179 Acute kidney failure, unspecified: Secondary | ICD-10-CM | POA: Insufficient documentation

## 2015-09-28 DIAGNOSIS — R4182 Altered mental status, unspecified: Secondary | ICD-10-CM | POA: Insufficient documentation

## 2015-09-28 DIAGNOSIS — S3991XA Unspecified injury of abdomen, initial encounter: Secondary | ICD-10-CM | POA: Insufficient documentation

## 2015-09-28 DIAGNOSIS — Z8679 Personal history of other diseases of the circulatory system: Secondary | ICD-10-CM | POA: Insufficient documentation

## 2015-09-28 DIAGNOSIS — F1910 Other psychoactive substance abuse, uncomplicated: Secondary | ICD-10-CM

## 2015-09-28 DIAGNOSIS — S32028A Other fracture of second lumbar vertebra, initial encounter for closed fracture: Secondary | ICD-10-CM | POA: Diagnosis not present

## 2015-09-28 DIAGNOSIS — Y998 Other external cause status: Secondary | ICD-10-CM | POA: Insufficient documentation

## 2015-09-28 DIAGNOSIS — F1210 Cannabis abuse, uncomplicated: Secondary | ICD-10-CM | POA: Insufficient documentation

## 2015-09-28 LAB — CBC WITH DIFFERENTIAL/PLATELET
BASOS ABS: 0 10*3/uL (ref 0.0–0.1)
Basophils Relative: 0 %
EOS PCT: 0 %
Eosinophils Absolute: 0 10*3/uL (ref 0.0–0.7)
HEMATOCRIT: 44.9 % (ref 39.0–52.0)
Hemoglobin: 14.4 g/dL (ref 13.0–17.0)
LYMPHS ABS: 0.8 10*3/uL (ref 0.7–4.0)
LYMPHS PCT: 9 %
MCH: 29.9 pg (ref 26.0–34.0)
MCHC: 32.1 g/dL (ref 30.0–36.0)
MCV: 93.3 fL (ref 78.0–100.0)
MONO ABS: 0.4 10*3/uL (ref 0.1–1.0)
Monocytes Relative: 4 %
NEUTROS ABS: 7.6 10*3/uL (ref 1.7–7.7)
Neutrophils Relative %: 87 %
PLATELETS: 186 10*3/uL (ref 150–400)
RBC: 4.81 MIL/uL (ref 4.22–5.81)
RDW: 13.3 % (ref 11.5–15.5)
WBC: 8.8 10*3/uL (ref 4.0–10.5)

## 2015-09-28 LAB — COMPREHENSIVE METABOLIC PANEL
ALBUMIN: 4.6 g/dL (ref 3.5–5.0)
ALT: 46 U/L (ref 17–63)
ANION GAP: 14 (ref 5–15)
AST: 73 U/L — AB (ref 15–41)
Alkaline Phosphatase: 42 U/L (ref 38–126)
BILIRUBIN TOTAL: 1.2 mg/dL (ref 0.3–1.2)
BUN: 9 mg/dL (ref 6–20)
CHLORIDE: 100 mmol/L — AB (ref 101–111)
CO2: 23 mmol/L (ref 22–32)
Calcium: 9.9 mg/dL (ref 8.9–10.3)
Creatinine, Ser: 1.46 mg/dL — ABNORMAL HIGH (ref 0.61–1.24)
GFR calc Af Amer: >60 mL/min (ref >60)
GFR calc non Af Amer: >60 mL/min (ref >60)
GLUCOSE: 96 mg/dL (ref 65–99)
POTASSIUM: 3.8 mmol/L (ref 3.5–5.1)
SODIUM: 137 mmol/L (ref 135–145)
TOTAL PROTEIN: 7.9 g/dL (ref 6.5–8.1)

## 2015-09-28 MED ORDER — LORAZEPAM 1 MG PO TABS
1.0000 mg | ORAL_TABLET | Freq: Once | ORAL | Status: AC
Start: 2015-09-28 — End: 2015-09-28
  Administered 2015-09-28: 1 mg via ORAL
  Filled 2015-09-28: qty 1

## 2015-09-28 MED ORDER — ONDANSETRON HCL 4 MG/2ML IJ SOLN
4.0000 mg | Freq: Once | INTRAMUSCULAR | Status: AC
  Administered 2015-09-28: 4 mg via INTRAVENOUS
  Filled 2015-09-28: qty 2

## 2015-09-28 MED ORDER — SODIUM CHLORIDE 0.9 % IV BOLUS (SEPSIS)
1000.0000 mL | Freq: Once | INTRAVENOUS | Status: AC
  Administered 2015-09-28: 1000 mL via INTRAVENOUS

## 2015-09-28 MED ORDER — KETOROLAC TROMETHAMINE 30 MG/ML IJ SOLN
30.0000 mg | Freq: Once | INTRAMUSCULAR | Status: AC
  Administered 2015-09-28: 30 mg via INTRAVENOUS
  Filled 2015-09-28: qty 1

## 2015-09-28 MED ORDER — HYDROCODONE-ACETAMINOPHEN 5-325 MG PO TABS
1.0000 | ORAL_TABLET | Freq: Once | ORAL | Status: AC
Start: 2015-09-28 — End: 2015-09-28
  Administered 2015-09-28: 1 via ORAL
  Filled 2015-09-28: qty 1

## 2015-09-28 MED ORDER — ONDANSETRON 4 MG PO TBDP
4.0000 mg | ORAL_TABLET | Freq: Once | ORAL | Status: AC
  Administered 2015-09-28: 4 mg via ORAL
  Filled 2015-09-28: qty 1

## 2015-09-28 MED ORDER — IOHEXOL 300 MG/ML  SOLN
100.0000 mL | Freq: Once | INTRAMUSCULAR | Status: AC | PRN
  Administered 2015-09-28: 100 mL via INTRAVENOUS

## 2015-09-28 MED ORDER — HYDROCODONE-ACETAMINOPHEN 5-325 MG PO TABS: 1.0000 | ORAL_TABLET | Freq: Four times a day (QID) | ORAL | Status: DC | PRN

## 2015-09-28 MED ORDER — TETANUS-DIPHTH-ACELL PERTUSSIS 5-2.5-18.5 LF-MCG/0.5 IM SUSP
0.5000 mL | Freq: Once | INTRAMUSCULAR | Status: AC
  Administered 2015-09-28: 0.5 mL via INTRAMUSCULAR
  Filled 2015-09-28: qty 0.5

## 2015-09-28 MED ORDER — PROMETHAZINE HCL 25 MG PO TABS: 25.0000 mg | ORAL_TABLET | Freq: Four times a day (QID) | ORAL | Status: DC | PRN

## 2015-09-28 NOTE — ED Notes (Addendum)
Pt is confusing, "I'm not doing anything until they go check on him". Pt's mother to bedside, reports he is referring to a sibling that was given up for adoption. Pt refusing to take ativan, is holding cup in his hand and refusing to take it. MD made aware. Pt is sitting on end of bed.
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
## 2015-10-03 ENCOUNTER — Emergency Department (HOSPITAL_COMMUNITY)
Admission: EM | Admit: 2015-10-03 | Discharge: 2015-10-03 | Disposition: A | Discharge status: Home / Self Care | Payer: No Typology Code available for payment source | Attending: Emergency Medicine | Admitting: Emergency Medicine

## 2015-10-03 ENCOUNTER — Encounter (HOSPITAL_COMMUNITY): Payer: Self-pay

## 2015-10-03 ENCOUNTER — Emergency Department (HOSPITAL_COMMUNITY): Payer: No Typology Code available for payment source

## 2015-10-03 DIAGNOSIS — Y9289 Other specified places as the place of occurrence of the external cause: Secondary | ICD-10-CM | POA: Diagnosis not present

## 2015-10-03 DIAGNOSIS — S61210A Laceration without foreign body of right index finger without damage to nail, initial encounter: Secondary | ICD-10-CM | POA: Insufficient documentation

## 2015-10-03 DIAGNOSIS — F17210 Nicotine dependence, cigarettes, uncomplicated: Secondary | ICD-10-CM | POA: Diagnosis not present

## 2015-10-03 DIAGNOSIS — S300XXA Contusion of lower back and pelvis, initial encounter: Secondary | ICD-10-CM | POA: Diagnosis not present

## 2015-10-03 DIAGNOSIS — Y9389 Activity, other specified: Secondary | ICD-10-CM | POA: Diagnosis not present

## 2015-10-03 DIAGNOSIS — S3992XA Unspecified injury of lower back, initial encounter: Secondary | ICD-10-CM | POA: Diagnosis present

## 2015-10-03 DIAGNOSIS — Z8679 Personal history of other diseases of the circulatory system: Secondary | ICD-10-CM | POA: Insufficient documentation

## 2015-10-03 DIAGNOSIS — R011 Cardiac murmur, unspecified: Secondary | ICD-10-CM | POA: Insufficient documentation

## 2015-10-03 DIAGNOSIS — Y998 Other external cause status: Secondary | ICD-10-CM | POA: Insufficient documentation

## 2015-10-03 MED ORDER — IBUPROFEN 400 MG PO TABS
400.0000 mg | ORAL_TABLET | Freq: Once | ORAL | Status: AC
  Administered 2015-10-03: 400 mg via ORAL
  Filled 2015-10-03: qty 1

## 2015-10-03 MED ORDER — HYDROCODONE-ACETAMINOPHEN 5-325 MG PO TABS: 2.0000 | ORAL_TABLET | Freq: Once | ORAL | Status: DC

## 2015-10-03 MED ORDER — OXYCODONE-ACETAMINOPHEN 5-325 MG PO TABS
ORAL_TABLET | ORAL | Status: AC
  Filled 2015-10-03: qty 1

## 2015-10-03 MED ORDER — HYDROCODONE-ACETAMINOPHEN 5-325 MG PO TABS: 1.0000 | ORAL_TABLET | Freq: Four times a day (QID) | ORAL | Status: DC | PRN

## 2015-10-03 MED ORDER — OXYCODONE-ACETAMINOPHEN 5-325 MG PO TABS
1.0000 | ORAL_TABLET | Freq: Once | ORAL | Status: AC
  Administered 2015-10-03: 1 via ORAL
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
## 2015-10-03 NOTE — Discharge Instructions (Signed)
It was our pleasure to provide your ER care today - we hope that you feel better.  Rest.   Take motrin or aleve as need for pain. You may also take hydrocodone as need for pain. No driving when taking hydrocodone. Also, do not take tylenol or acetaminophen containing medication when taking hydrocodone.  Keep finger wound very clean/dry.  Wash with warm water and soap 2x/day.  You may apply a thin coat of bacitracin and bandaid as need for the next few days.   For back pain, and recent transverse process fractures noted on CT scan, follow up with primary care doctor in the coming week.  Return to ER if worse, new symptoms, fevers, infection of wound, intractable back pain, numbness/weakness, other concern.  You were given pain medication in the ER - no driving for the next 4 hours.         Contusion A contusion is a deep bruise. Contusions are the result of a blunt injury to tissues and muscle fibers under the skin. The injury causes bleeding under the skin. The skin overlying the contusion may turn blue, purple, or yellow. Minor injuries will give you a painless contusion, but more severe contusions may stay painful and swollen for a few weeks.  CAUSES  This condition is usually caused by a blow, trauma, or direct force to an area of the body. SYMPTOMS  Symptoms of this condition include:  Swelling of the injured area.  Pain and tenderness in the injured area.  Discoloration. The area may have redness and then turn blue, purple, or yellow. DIAGNOSIS  This condition is diagnosed based on a physical exam and medical history. An X-ray, CT scan, or MRI may be needed to determine if there are any associated injuries, such as broken bones (fractures). TREATMENT  Specific treatment for this condition depends on what area of the body was injured. In general, the best treatment for a contusion is resting, icing, applying pressure to (compression), and elevating the injured area. This is  often called the RICE strategy. Over-the-counter anti-inflammatory medicines may also be recommended for pain control.  HOME CARE INSTRUCTIONS   Rest the injured area.  If directed, apply ice to the injured area:  Put ice in a plastic bag.  Place a towel between your skin and the bag.  Leave the ice on for 20 minutes, 2-3 times per day.  If directed, apply light compression to the injured area using an elastic bandage. Make sure the bandage is not wrapped too tightly. Remove and reapply the bandage as directed by your health care provider.  If possible, raise (elevate) the injured area above the level of your heart while you are sitting or lying down.  Take over-the-counter and prescription medicines only as told by your health care provider. SEEK MEDICAL CARE IF:  Your symptoms do not improve after several days of treatment.  Your symptoms get worse.  You have difficulty moving the injured area. SEEK IMMEDIATE MEDICAL CARE IF:   You have severe pain.  You have numbness in a hand or foot.  Your hand or foot turns pale or cold.   This information is not intended to replace advice given to you by your health care provider. Make sure you discuss any questions you have with your health care provider.   Document Released: 07/10/2005 Document Revised: 06/21/2015 Document Reviewed: 02/15/2015 Elsevier Interactive Patient Education 2016 [employer]     Vertebral Fracture A vertebral fracture is a break in one  of the bones that make up the spine (vertebrae). The vertebrae are stacked on top of each other to form the spinal column. They support the body and protect the spinal cord. The vertebral column has an upper part (cervical spine), a middle part (thoracic spine), and a lower part (lumbar spine). Most vertebral fractures occur in the thoracic spine or lumbar spine. There are three main types of vertebral fractures:  Flexion fracture. This happens when vertebrae collapse.  Vertebrae can collapse:  In the front (compression fracture). This type of fracture is common in people who have a condition that causes their bones to be weak and brittle (osteoporosis). The fracture can make a person lose height.  In the front and back (axial burst fracture).  Extension fracture. This happens when an external force pulls apart the vertebrae.  Rotation fracture. This happens when the spine bends extremely in one direction. This type can cause a piece of a vertebra to break off (transverse process fracture) or move out of its normal position (fracture dislocation). This type of fracture has a high risk for spinal cord injury. Vertebral fractures can range from mild to very severe. The most severe types are those that cause the broken bones to move out of place (unstable) and those that injure or press on the spinal cord. CAUSES This condition is usually caused by a forceful injury. This type of injury commonly results from:  Car accidents.  Falling or jumping from a great height.  Collisions in contact sports.  Violent acts, such as an assault or a gunshot wound. RISK FACTORS This injury is more likely to happen to people who:  Have osteoporosis.  Participate in contact sports.  Are in situations that could result in falls or other violent injuries. SYMPTOMS Symptoms of this injury depend on the location and the type of fracture. The most common symptom is back pain that gets worse with movement. You may also have trouble standing or walking. If a fracture has damaged your spinal cord or is pressing on it, you may also have:  Numbness.  Tingling.  Weakness.  Loss of movement.  Loss of bowel or bladder control. DIAGNOSIS This injury may be diagnosed based on symptoms, medical history, and a physical exam. You may also have imaging tests to confirm the diagnosis. These may include:  Spine X-ray.  CT scan.  MRI. TREATMENT Treatment for this injury  depends on the type of fracture. If your fracture is stable and does not affect your spinal cord, it may heal with nonsurgical treatment, such as:  Taking pain medicine.  Wearing a cast or a brace.  Doing physical therapy exercises. If your vertebral fracture is unstable or it affects your spinal cord, you may need surgical treatment, such as:  Laminectomy. This procedure involves removing the part of a vertebra that is pushing on the spinal cord (spinal decompression surgery). Bone fragments may also be removed.  Spinal fusion. This procedure is used to stabilize an unstable fracture. Vertebrae may be joined together with a piece of bone from another part of your body (graft) and held in place with rods, plates, or screws.  Vertebroplasty. In this procedure, bone cement is used to rebuild collapsed vertebrae. HOME CARE INSTRUCTIONS General Instructions  Take medicines only as directed by your health care provider.  Do not drive or operate heavy machinery while taking pain medicine.  If directed, apply ice to the injured area:  Put ice in a plastic bag.  Place a towel  between your skin and the bag.  Leave the ice on for 30 minutes every two hours at first. Then apply the ice as needed.  Wear your neck brace or back brace as directed by your health care provider.  Do not drink alcohol. Alcohol can interfere with your treatment.  Keep all follow-up visits as directed by your health care provider. This is important. It can help to prevent permanent injury, disability, and long-lasting (chronic) pain. Activity  Stay in bed (on bed rest) only as directed by your health care provider. Being on bed rest for too long can make your condition worse.  Return to your normal activities as directed by your health care provider. Ask what activities are safe for you.  Do exercises to improve motion and strength in your back (physical therapy), as recommended by your health care provider.    Exercise regularly as directed by your health care provider. SEEK MEDICAL CARE IF:  You have a fever.  You develop a cough that makes your pain worse.  Your pain medicine is not helping.  Your pain does not get better over time.  You cannot return to your normal activities as planned or expected. SEEK IMMEDIATE MEDICAL CARE IF:  Your pain is very bad and it suddenly gets worse.  You are unable to move any body part (paralysis) that is below the level of your injury.  You have numbness, tingling, or weakness in any body part that is below the level of your injury.  You cannot control your bladder or bowels.   This information is not intended to replace advice given to you by your health care provider. Make sure you discuss any questions you have with your health care provider.   Document Released: 11/07/2004 Document Revised: 02/14/2015 Document Reviewed: 10/05/2014 Elsevier Interactive Patient Education 2016 [employer]     Laceration Care, Adult A laceration is a cut that goes through all of the layers of the skin and into the tissue that is right under the skin. Some lacerations heal on their own. Others need to be closed with stitches (sutures), staples, skin adhesive strips, or skin glue. Proper laceration care minimizes the risk of infection and helps the laceration to heal better. HOW TO CARE FOR YOUR LACERATION If sutures or staples were used:  Keep the wound clean and dry.  If you were given a bandage (dressing), you should change it at least one time per day or as told by your health care provider. You should also change it if it becomes wet or dirty.  Keep the wound completely dry for the first 24 hours or as told by your health care provider. After that time, you may shower or bathe. However, make sure that the wound is not soaked in water until after the sutures or staples have been removed.  Clean the wound one time each day or as told by your health care  provider:  Wash the wound with soap and water.  Rinse the wound with water to remove all soap.  Pat the wound dry with a clean towel. Do not rub the wound.  After cleaning the wound, apply a thin layer of antibiotic ointmentas told by your health care provider. This will help to prevent infection and keep the dressing from sticking to the wound.  Have the sutures or staples removed as told by your health care provider. If skin adhesive strips were used:  Keep the wound clean and dry.  If you were given  a bandage (dressing), you should change it at least one time per day or as told by your health care provider. You should also change it if it becomes dirty or wet.  Do not get the skin adhesive strips wet. You may shower or bathe, but be careful to keep the wound dry.  If the wound gets wet, pat it dry with a clean towel. Do not rub the wound.  Skin adhesive strips fall off on their own. You may trim the strips as the wound heals. Do not remove skin adhesive strips that are still stuck to the wound. They will fall off in time. If skin glue was used:  Try to keep the wound dry, but you may briefly wet it in the shower or bath. Do not soak the wound in water, such as by swimming.  After you have showered or bathed, gently pat the wound dry with a clean towel. Do not rub the wound.  Do not do any activities that will make you sweat heavily until the skin glue has fallen off on its own.  Do not apply liquid, cream, or ointment medicine to the wound while the skin glue is in place. Using those may loosen the film before the wound has healed.  If you were given a bandage (dressing), you should change it at least one time per day or as told by your health care provider. You should also change it if it becomes dirty or wet.  If a dressing is placed over the wound, be careful not to apply tape directly over the skin glue. Doing that may cause the glue to be pulled off before the wound has  healed.  Do not pick at the glue. The skin glue usually remains in place for 5-10 days, then it falls off of the skin. General Instructions  Take over-the-counter and prescription medicines only as told by your health care provider.  If you were prescribed an antibiotic medicine or ointment, take or apply it as told by your doctor. Do not stop using it even if your condition improves.  To help prevent scarring, make sure to cover your wound with sunscreen whenever you are outside after stitches are removed, after adhesive strips are removed, or when glue remains in place and the wound is healed. Make sure to wear a sunscreen of at least 30 SPF.  Do not scratch or pick at the wound.  Keep all follow-up visits as told by your health care provider. This is important.  Check your wound every day for signs of infection. Watch for:  Redness, swelling, or pain.  Fluid, blood, or pus.  Raise (elevate) the injured area above the level of your heart while you are sitting or lying down, if possible. SEEK MEDICAL CARE IF:  You received a tetanus shot and you have swelling, severe pain, redness, or bleeding at the injection site.  You have a fever.  A wound that was closed breaks open.  You notice a bad smell coming from your wound or your dressing.  You notice something coming out of the wound, such as wood or glass.  Your pain is not controlled with medicine.  You have increased redness, swelling, or pain at the site of your wound.  You have fluid, blood, or pus coming from your wound.  You notice a change in the color of your skin near your wound.  You need to change the dressing frequently due to fluid, blood, or pus draining from the  wound.  You develop a new rash.  You develop numbness around the wound. SEEK IMMEDIATE MEDICAL CARE IF:  You develop severe swelling around the wound.  Your pain suddenly increases and is severe.  You develop painful lumps near the wound or  on skin that is anywhere on your body.  You have a red streak going away from your wound.  The wound is on your hand or foot and you cannot properly move a finger or toe.  The wound is on your hand or foot and you notice that your fingers or toes look pale or bluish.   This information is not intended to replace advice given to you by your health care provider. Make sure you discuss any questions you have with your health care provider.   Document Released: 09/30/2005 Document Revised: 02/14/2015 Document Reviewed: 09/26/2014 Elsevier Interactive Patient Education [employer]
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
## 2016-12-01 ENCOUNTER — Encounter (HOSPITAL_COMMUNITY): Payer: Self-pay | Admitting: Emergency Medicine

## 2016-12-01 ENCOUNTER — Emergency Department (HOSPITAL_COMMUNITY)
Admission: EM | Admit: 2016-12-01 | Discharge: 2016-12-02 | Disposition: A | Discharge status: Home / Self Care | Payer: Self-pay | Attending: Emergency Medicine | Admitting: Emergency Medicine

## 2016-12-01 DIAGNOSIS — R109 Unspecified abdominal pain: Secondary | ICD-10-CM | POA: Insufficient documentation

## 2016-12-01 DIAGNOSIS — Z79899 Other long term (current) drug therapy: Secondary | ICD-10-CM | POA: Insufficient documentation

## 2016-12-01 DIAGNOSIS — R112 Nausea with vomiting, unspecified: Secondary | ICD-10-CM | POA: Insufficient documentation

## 2016-12-01 DIAGNOSIS — R1110 Vomiting, unspecified: Secondary | ICD-10-CM

## 2016-12-01 DIAGNOSIS — F17210 Nicotine dependence, cigarettes, uncomplicated: Secondary | ICD-10-CM | POA: Insufficient documentation

## 2016-12-01 DIAGNOSIS — R197 Diarrhea, unspecified: Secondary | ICD-10-CM | POA: Insufficient documentation

## 2016-12-01 LAB — CBC
HEMATOCRIT: 42.1 % (ref 39.0–52.0)
HEMOGLOBIN: 14.3 g/dL (ref 13.0–17.0)
MCH: 30.4 pg (ref 26.0–34.0)
MCHC: 34 g/dL (ref 30.0–36.0)
MCV: 89.6 fL (ref 78.0–100.0)
Platelets: 188 10*3/uL (ref 150–400)
RBC: 4.7 MIL/uL (ref 4.22–5.81)
RDW: 13.1 % (ref 11.5–15.5)
WBC: 5.7 10*3/uL (ref 4.0–10.5)

## 2016-12-01 LAB — URINALYSIS, ROUTINE W REFLEX MICROSCOPIC
BILIRUBIN URINE: NEGATIVE
GLUCOSE, UA: NEGATIVE mg/dL
HGB URINE DIPSTICK: NEGATIVE
KETONES UR: 20 mg/dL — AB
Leukocytes, UA: NEGATIVE
Nitrite: NEGATIVE
PROTEIN: NEGATIVE mg/dL
Specific Gravity, Urine: 1.023 (ref 1.005–1.030)
pH: 5 (ref 5.0–8.0)

## 2016-12-01 LAB — LIPASE, BLOOD: LIPASE: 24 U/L (ref 11–51)

## 2016-12-01 LAB — COMPREHENSIVE METABOLIC PANEL
ALBUMIN: 4.7 g/dL (ref 3.5–5.0)
ALT: 42 U/L (ref 17–63)
ANION GAP: 8 (ref 5–15)
AST: 31 U/L (ref 15–41)
Alkaline Phosphatase: 40 U/L (ref 38–126)
BILIRUBIN TOTAL: 1.1 mg/dL (ref 0.3–1.2)
BUN: 12 mg/dL (ref 6–20)
CHLORIDE: 103 mmol/L (ref 101–111)
CO2: 27 mmol/L (ref 22–32)
Calcium: 9.5 mg/dL (ref 8.9–10.3)
Creatinine, Ser: 1.17 mg/dL (ref 0.61–1.24)
GFR calc Af Amer: >60 mL/min (ref >60)
Glucose, Bld: 85 mg/dL (ref 65–99)
POTASSIUM: 4 mmol/L (ref 3.5–5.1)
Sodium: 138 mmol/L (ref 135–145)
TOTAL PROTEIN: 7.9 g/dL (ref 6.5–8.1)

## 2016-12-01 MED ORDER — DICYCLOMINE HCL 10 MG PO CAPS
10.0000 mg | ORAL_CAPSULE | Freq: Once | ORAL | Status: AC
Start: 2016-12-01 — End: 2016-12-01
  Administered 2016-12-01: 10 mg via ORAL
  Filled 2016-12-01: qty 1

## 2016-12-01 MED ORDER — ONDANSETRON HCL 4 MG/2ML IJ SOLN
4.0000 mg | Freq: Once | INTRAMUSCULAR | Status: AC
  Administered 2016-12-01: 4 mg via INTRAVENOUS
  Filled 2016-12-01: qty 2

## 2016-12-01 MED ORDER — LOPERAMIDE HCL 2 MG PO CAPS
4.0000 mg | ORAL_CAPSULE | Freq: Once | ORAL | Status: AC
  Administered 2016-12-01: 4 mg via ORAL
  Filled 2016-12-01: qty 2

## 2016-12-01 MED ORDER — SODIUM CHLORIDE 0.9 % IV BOLUS (SEPSIS)
1000.0000 mL | Freq: Once | INTRAVENOUS | Status: AC
  Administered 2016-12-01: 1000 mL via INTRAVENOUS

## 2016-12-01 NOTE — ED Triage Notes (Signed)
Pt c/o N/V/D with abdominal pain that began at noon today; pt c/o headache as well; pt states he's had the flu twice this winter and this feels similar; pt states it "feels like I'm having a baby"

## 2016-12-01 NOTE — ED Provider Notes (Signed)
[facility]-EMERGENCY DEPT Provider Note   CSN: [id number] Arrival date & time: 12/01/16  2044  By signing my name below, I, Teofilo PodMatthew P. Jamison, attest that this documentation has been prepared under the direction and in the presence of Shon Batonourtney F Margaretha Mahan, MD . Electronically Signed: Teofilo PodMatthew P. Jamison, ED Scribe. 12/01/2016. 11:26 PM.    History   Chief Complaint Chief Complaint  Patient presents with  . N/V/D  . Abdominal Pain    The history is provided by the patient. No language interpreter was used.   HPI Comments:  Jacob Johnston is a 26 y.o. male who presents to the Emergency Department complaining of constant abdominal pain x 12 hours. Pt rates his pain at 3/10, states that he has not eaten since onset. Pt complains of associated headache, nausea, vomiting, diarrhea, diaphoresis. Pt states that this is the 4th time that he has had these symptoms this year. Pt states that he has seen blood in his vomit. Pt took Mucinex but states that he vomited immediately after taking it. No alleviating factors noted. Pt reports daily marijuana use. Pt states that he is otherwise healthy, and notes no known allergies to medication. Pt denies cough, chest pain, SOB, congestion, sore throat.   Past Medical History:  Diagnosis Date  . Heart murmur   . Migraines     There are no active problems to display for this patient.   History reviewed. No pertinent surgical history.     Home Medications    Prior to Admission medications   Medication Sig Start Date End Date Taking? Authorizing Provider  azithromycin (ZITHROMAX) 250 MG tablet Take 4 tablets (1,000 mg total) by mouth once. Patient not taking: Reported on 06/12/2015 03/03/14   Rodolph BongEvan S Corey, MD  HYDROcodone-acetaminophen (NORCO/VICODIN) 5-325 MG tablet Take 1-2 tablets by mouth every 6 (six) hours as needed for severe pain. Patient not taking: Reported on 12/01/2016 09/28/15   Laurence Spatesachel Morgan Leser, MD  HYDROcodone-acetaminophen  (NORCO/VICODIN) 5-325 MG tablet Take 1-2 tablets by mouth every 6 (six) hours as needed for moderate pain. Patient not taking: Reported on 12/01/2016 10/03/15   Cathren LaineKevin Steinl, MD  loperamide (IMODIUM) 2 MG capsule Take 1 capsule (2 mg total) by mouth 4 (four) times daily as needed for diarrhea or loose stools. 12/02/16   Shon Batonourtney F Armetta Henri, MD  ondansetron (ZOFRAN ODT) 4 MG disintegrating tablet Take 1 tablet (4 mg total) by mouth every 8 (eight) hours as needed for nausea or vomiting. 12/02/16   Shon Batonourtney F Shamarr Faucett, MD    Family History No family history on file.  Social History Social History  Substance Use Topics  . Smoking status: Current Every Day Smoker    Packs/day: 0.50    Types: Cigarettes  . Smokeless tobacco: Never Used  . Alcohol use Yes     Comment: 2x a week     Allergies   Patient has no known allergies.   Review of Systems Review of Systems  Constitutional: Positive for diaphoresis.  HENT: Negative for sore throat.   Respiratory: Negative for cough and shortness of breath.   Cardiovascular: Negative for chest pain.  Gastrointestinal: Positive for abdominal pain, diarrhea, nausea and vomiting.  Neurological: Positive for headaches.  All other systems reviewed and are negative.    Physical Exam Updated Vital Signs BP 122/67 (BP Location: Right Arm)   Pulse 62   Temp 98.7 F (37.1 C) (Oral)   Resp 20   Ht 6\' 1"  (1.854 m)   Wt 150  lb (68 kg)   SpO2 100%   BMI 19.79 kg/m   Physical Exam  Constitutional: He is oriented to person, place, and time. He appears well-developed and well-nourished.  HENT:  Head: Normocephalic and atraumatic.  Mucous membranes moist  Cardiovascular: Normal rate, regular rhythm and normal heart sounds.   No murmur heard. Pulmonary/Chest: Effort normal and breath sounds normal. No respiratory distress. He has no wheezes.  Abdominal: Soft. Bowel sounds are normal. There is no tenderness. There is no rebound and no guarding.    Musculoskeletal: He exhibits no edema.  Neurological: He is alert and oriented to person, place, and time.  Skin: Skin is warm and dry.  Psychiatric: He has a normal mood and affect.  Nursing note and vitals reviewed.    ED Treatments / Results  DIAGNOSTIC STUDIES:  Oxygen Saturation is 97% on RA, normal by my interpretation.    COORDINATION OF CARE:  11:26 PM Discussed treatment plan with pt at bedside and pt agreed to plan.   Labs (all labs ordered are listed, but only abnormal results are displayed) Labs Reviewed  URINALYSIS, ROUTINE W REFLEX MICROSCOPIC - Abnormal; Notable for the following:       Result Value   Ketones, ur 20 (*)    All other components within normal limits  LIPASE, BLOOD  COMPREHENSIVE METABOLIC PANEL  CBC    EKG  EKG Interpretation None       Radiology No results found.  Procedures Procedures (including critical care time)  Medications Ordered in ED Medications  sodium chloride 0.9 % bolus 1,000 mL (1,000 mLs Intravenous New Bag/Given 12/01/16 2232)  dicyclomine (BENTYL) capsule 10 mg (10 mg Oral Given 12/01/16 2351)  loperamide (IMODIUM) capsule 4 mg (4 mg Oral Given 12/01/16 2351)  ondansetron (ZOFRAN) injection 4 mg (4 mg Intravenous Given 12/01/16 2351)     Initial Impression / Assessment and Plan / ED Course  I have reviewed the triage vital signs and the nursing notes.  Pertinent labs & imaging results that were available during my care of the patient were reviewed by me and considered in my medical decision making (see chart for details).     Patient presents with nausea, vomiting, abdominal pain, diarrhea. Nontoxic on exam. Vital signs reassuring. Lab work is normal. Abdominal exam is benign. Suspect viral etiology. Patient was given Bentyl and Imodium with fluids.  He's able to tolerate fluids. He does report daily marijuana use. Discussed with him that this may also contribute to excessive vomiting.  After history, exam, and  medical workup I feel the patient has been appropriately medically screened and is safe for discharge home. Pertinent diagnoses were discussed with the patient. Patient was given return precautions.   Final Clinical Impressions(s) / ED Diagnoses   Final diagnoses:  Abdominal pain, vomiting, and diarrhea    New Prescriptions New Prescriptions   LOPERAMIDE (IMODIUM) 2 MG CAPSULE    Take 1 capsule (2 mg total) by mouth 4 (four) times daily as needed for diarrhea or loose stools.   ONDANSETRON (ZOFRAN ODT) 4 MG DISINTEGRATING TABLET    Take 1 tablet (4 mg total) by mouth every 8 (eight) hours as needed for nausea or vomiting.   I personally performed the services described in this documentation, which was scribed in my presence. The recorded information has been reviewed and is accurate.     Shon Baton, MD 12/02/16 [phone]

## 2016-12-02 MED ORDER — ONDANSETRON 4 MG PO TBDP: 4.0000 mg | ORAL_TABLET | Freq: Three times a day (TID) | ORAL | 0 refills | Status: DC | PRN

## 2016-12-02 MED ORDER — LOPERAMIDE HCL 2 MG PO CAPS: 2.0000 mg | ORAL_CAPSULE | Freq: Four times a day (QID) | ORAL | 0 refills | Status: AC | PRN

## 2016-12-02 NOTE — Discharge Instructions (Signed)
You likely have a virus. It is important to stay hydrated. You should also abstain from excessive marijuana use as this can contribute to vomiting.

## 2017-02-16 ENCOUNTER — Emergency Department (HOSPITAL_COMMUNITY): Payer: No Typology Code available for payment source

## 2017-02-16 ENCOUNTER — Encounter (HOSPITAL_COMMUNITY): Payer: Self-pay | Admitting: Emergency Medicine

## 2017-02-16 ENCOUNTER — Emergency Department (HOSPITAL_COMMUNITY)
Admission: EM | Admit: 2017-02-16 | Discharge: 2017-02-17 | Disposition: A | Discharge status: Home / Self Care | Payer: No Typology Code available for payment source | Attending: Emergency Medicine | Admitting: Emergency Medicine

## 2017-02-16 DIAGNOSIS — M25562 Pain in left knee: Secondary | ICD-10-CM

## 2017-02-16 DIAGNOSIS — Y939 Activity, unspecified: Secondary | ICD-10-CM | POA: Diagnosis not present

## 2017-02-16 DIAGNOSIS — Y92410 Unspecified street and highway as the place of occurrence of the external cause: Secondary | ICD-10-CM | POA: Insufficient documentation

## 2017-02-16 DIAGNOSIS — Y999 Unspecified external cause status: Secondary | ICD-10-CM | POA: Insufficient documentation

## 2017-02-16 DIAGNOSIS — F17210 Nicotine dependence, cigarettes, uncomplicated: Secondary | ICD-10-CM | POA: Diagnosis not present

## 2017-02-16 DIAGNOSIS — Z79899 Other long term (current) drug therapy: Secondary | ICD-10-CM | POA: Insufficient documentation

## 2017-02-16 DIAGNOSIS — M25462 Effusion, left knee: Secondary | ICD-10-CM | POA: Diagnosis not present

## 2017-02-16 DIAGNOSIS — S8992XA Unspecified injury of left lower leg, initial encounter: Secondary | ICD-10-CM | POA: Diagnosis present

## 2017-02-16 NOTE — ED Notes (Signed)
Bed: [facility] Expected date:  Expected time:  Means of arrival:  Comments: 

## 2017-02-16 NOTE — ED Notes (Signed)
Called for triage x1 without answer. 
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
## 2017-02-16 NOTE — ED Notes (Signed)
Patient called for triage x2 with no response 
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
## 2017-02-17 MED ORDER — IBUPROFEN 600 MG PO TABS: 600.0000 mg | ORAL_TABLET | Freq: Four times a day (QID) | ORAL | 0 refills | Status: DC | PRN

## 2017-02-17 NOTE — ED Notes (Addendum)
PT has ice applied to right knee, pt is c/o pain 7/10, pt has minor abrasions noted on right leg.
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
## 2017-11-02 ENCOUNTER — Emergency Department (HOSPITAL_COMMUNITY)
Admission: EM | Admit: 2017-11-02 | Discharge: 2017-11-02 | Disposition: A | Discharge status: Home / Self Care | Payer: Self-pay | Attending: Emergency Medicine | Admitting: Emergency Medicine

## 2017-11-02 ENCOUNTER — Emergency Department (HOSPITAL_COMMUNITY): Payer: Self-pay

## 2017-11-02 ENCOUNTER — Encounter (HOSPITAL_COMMUNITY): Payer: Self-pay | Admitting: Emergency Medicine

## 2017-11-02 DIAGNOSIS — S46912A Strain of unspecified muscle, fascia and tendon at shoulder and upper arm level, left arm, initial encounter: Secondary | ICD-10-CM | POA: Insufficient documentation

## 2017-11-02 DIAGNOSIS — Y939 Activity, unspecified: Secondary | ICD-10-CM | POA: Insufficient documentation

## 2017-11-02 DIAGNOSIS — X500XXA Overexertion from strenuous movement or load, initial encounter: Secondary | ICD-10-CM | POA: Insufficient documentation

## 2017-11-02 DIAGNOSIS — F17210 Nicotine dependence, cigarettes, uncomplicated: Secondary | ICD-10-CM | POA: Insufficient documentation

## 2017-11-02 DIAGNOSIS — Y929 Unspecified place or not applicable: Secondary | ICD-10-CM | POA: Insufficient documentation

## 2017-11-02 DIAGNOSIS — Z79899 Other long term (current) drug therapy: Secondary | ICD-10-CM | POA: Insufficient documentation

## 2017-11-02 DIAGNOSIS — Y999 Unspecified external cause status: Secondary | ICD-10-CM | POA: Insufficient documentation

## 2017-11-02 MED ORDER — TRAMADOL HCL 50 MG PO TABS: 50.0000 mg | ORAL_TABLET | Freq: Four times a day (QID) | ORAL | 0 refills | Status: DC | PRN

## 2017-11-02 MED ORDER — IBUPROFEN 800 MG PO TABS: 800.0000 mg | ORAL_TABLET | Freq: Three times a day (TID) | ORAL | 0 refills | Status: DC

## 2017-11-02 MED ORDER — IBUPROFEN 800 MG PO TABS
800.0000 mg | ORAL_TABLET | Freq: Once | ORAL | Status: AC
  Administered 2017-11-02: 800 mg via ORAL
  Filled 2017-11-02: qty 1
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
## 2017-11-02 NOTE — ED Notes (Signed)
Pt refused ice and immobilization.

## 2017-11-02 NOTE — Discharge Instructions (Signed)

## 2017-11-02 NOTE — ED Notes (Signed)
Shoulder immobilizer placed and pt educated about proper position and use of sling.
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
## 2018-01-03 ENCOUNTER — Other Ambulatory Visit: Payer: Self-pay

## 2018-01-03 ENCOUNTER — Ambulatory Visit (INDEPENDENT_AMBULATORY_CARE_PROVIDER_SITE_OTHER): Payer: Self-pay

## 2018-01-03 ENCOUNTER — Ambulatory Visit (HOSPITAL_COMMUNITY)
Admission: EM | Admit: 2018-01-03 | Discharge: 2018-01-03 | Disposition: A | Discharge status: Home / Self Care | Payer: Self-pay | Attending: Family Medicine | Admitting: Family Medicine

## 2018-01-03 ENCOUNTER — Encounter (HOSPITAL_COMMUNITY): Payer: Self-pay | Admitting: Emergency Medicine

## 2018-01-03 DIAGNOSIS — S62339A Displaced fracture of neck of unspecified metacarpal bone, initial encounter for closed fracture: Secondary | ICD-10-CM

## 2018-01-03 MED ORDER — TRAMADOL HCL 50 MG PO TABS: 50.0000 mg | ORAL_TABLET | Freq: Four times a day (QID) | ORAL | 0 refills | Status: DC | PRN

## 2018-01-03 MED ORDER — IBUPROFEN 800 MG PO TABS: 800.0000 mg | ORAL_TABLET | Freq: Three times a day (TID) | ORAL | 0 refills | Status: DC

## 2018-01-03 MED ORDER — IBUPROFEN 800 MG PO TABS
ORAL_TABLET | ORAL | Status: AC
  Filled 2018-01-03: qty 1

## 2018-01-03 MED ORDER — IBUPROFEN 800 MG PO TABS
800.0000 mg | ORAL_TABLET | Freq: Once | ORAL | Status: AC
  Administered 2018-01-03: 800 mg via ORAL

## 2018-01-03 NOTE — Progress Notes (Signed)
Orthopedic Tech Progress Note Patient Details:  Jacob Johnston 01/04/1991 [id number]  Patient ID: Jacob Johnston, male   DOB: 01/04/1991, 27 y.o.   MRN: [id number]   Nikki DomCrawford, Luana Tatro 01/03/2018, 4:28 PM Delete one ortho charge
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
## 2018-01-03 NOTE — ED Notes (Signed)
Patient is answering phone calls during nursing assessment

## 2018-01-03 NOTE — Discharge Instructions (Signed)
Ice, elevation, ibuprofen, tramadol as needed for pain. Please call Dr. Janee Mornhompson with [facility] on Monday to schedule an appointment for definitive treatment.
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
## 2018-01-03 NOTE — ED Notes (Signed)
Ortho paged again 

## 2018-01-03 NOTE — ED Notes (Signed)
Ortho paged. 

## 2018-01-03 NOTE — Progress Notes (Signed)
Orthopedic Tech Progress Note Patient Details:  Jacob Johnston [birth year] [id number]  Ortho Devices Type of Ortho Device: Arm sling, Ulna gutter splint Ortho Device/Splint Location: rue Ortho Device/Splint Interventions: Application   Post Interventions Patient Tolerated: Well Instructions Provided: Care of device   Jacob Johnston, Jacob Johnston 01/03/2018, 4:28 PM

## 2021-10-23 ENCOUNTER — Ambulatory Visit
Admission: EM | Admit: 2021-10-23 | Discharge: 2021-10-23 | Discharge status: Absent without leave | Payer: Medicaid Other

## 2021-10-25 ENCOUNTER — Emergency Department (HOSPITAL_COMMUNITY)
Admission: EM | Admit: 2021-10-25 | Discharge: 2021-10-25 | Disposition: A | Discharge status: Home / Self Care | Payer: No Typology Code available for payment source | Attending: Emergency Medicine | Admitting: Emergency Medicine

## 2021-10-25 ENCOUNTER — Emergency Department (HOSPITAL_COMMUNITY): Payer: No Typology Code available for payment source

## 2021-10-25 ENCOUNTER — Encounter (HOSPITAL_COMMUNITY): Payer: Self-pay

## 2021-10-25 DIAGNOSIS — G8911 Acute pain due to trauma: Secondary | ICD-10-CM | POA: Diagnosis not present

## 2021-10-25 DIAGNOSIS — R519 Headache, unspecified: Secondary | ICD-10-CM

## 2021-10-25 DIAGNOSIS — R7309 Other abnormal glucose: Secondary | ICD-10-CM | POA: Insufficient documentation

## 2021-10-25 DIAGNOSIS — Y92410 Unspecified street and highway as the place of occurrence of the external cause: Secondary | ICD-10-CM | POA: Diagnosis not present

## 2021-10-25 LAB — CBC WITH DIFFERENTIAL/PLATELET
Abs Immature Granulocytes: 0 10*3/uL (ref 0.00–0.07)
Basophils Absolute: 0 10*3/uL (ref 0.0–0.1)
Basophils Relative: 0 %
Eosinophils Absolute: 0.1 10*3/uL (ref 0.0–0.5)
Eosinophils Relative: 2 %
HCT: 40.1 % (ref 39.0–52.0)
Hemoglobin: 13 g/dL (ref 13.0–17.0)
Immature Granulocytes: 0 %
Lymphocytes Relative: 44 %
Lymphs Abs: 1.6 10*3/uL (ref 0.7–4.0)
MCH: 31.3 pg (ref 26.0–34.0)
MCHC: 32.4 g/dL (ref 30.0–36.0)
MCV: 96.6 fL (ref 80.0–100.0)
Monocytes Absolute: 0.3 10*3/uL (ref 0.1–1.0)
Monocytes Relative: 8 %
Neutro Abs: 1.7 10*3/uL (ref 1.7–7.7)
Neutrophils Relative %: 46 %
Platelets: 170 10*3/uL (ref 150–400)
RBC: 4.15 MIL/uL — ABNORMAL LOW (ref 4.22–5.81)
RDW: 13.8 % (ref 11.5–15.5)
WBC: 3.6 10*3/uL — ABNORMAL LOW (ref 4.0–10.5)
nRBC: 0 % (ref 0.0–0.2)

## 2021-10-25 LAB — BASIC METABOLIC PANEL
Anion gap: 7 (ref 5–15)
BUN: 14 mg/dL (ref 6–20)
CO2: 23 mmol/L (ref 22–32)
Calcium: 8.5 mg/dL — ABNORMAL LOW (ref 8.9–10.3)
Chloride: 105 mmol/L (ref 98–111)
Creatinine, Ser: 1.12 mg/dL (ref 0.61–1.24)
GFR, Estimated: >60 mL/min (ref >60)
Glucose, Bld: 83 mg/dL (ref 70–99)
Potassium: 4 mmol/L (ref 3.5–5.1)
Sodium: 135 mmol/L (ref 135–145)

## 2021-10-25 LAB — CBG MONITORING, ED: Glucose-Capillary: 89 mg/dL (ref 70–99)

## 2021-10-25 MED ORDER — KETOROLAC TROMETHAMINE 30 MG/ML IJ SOLN
30.0000 mg | Freq: Once | INTRAMUSCULAR | Status: AC
  Administered 2021-10-25: 30 mg via INTRAVENOUS
  Filled 2021-10-25: qty 1

## 2021-10-25 MED ORDER — SODIUM CHLORIDE 0.9 % IV BOLUS
500.0000 mL | Freq: Once | INTRAVENOUS | Status: AC
  Administered 2021-10-25: 500 mL via INTRAVENOUS

## 2021-10-25 MED ORDER — ACETAMINOPHEN 500 MG PO TABS
1000.0000 mg | ORAL_TABLET | Freq: Once | ORAL | Status: AC
  Administered 2021-10-25: 1000 mg via ORAL
  Filled 2021-10-25: qty 2
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
## 2021-10-30 ENCOUNTER — Encounter (HOSPITAL_COMMUNITY): Payer: Self-pay

## 2021-10-30 ENCOUNTER — Emergency Department (HOSPITAL_COMMUNITY)
Admission: EM | Admit: 2021-10-30 | Discharge: 2021-10-30 | Disposition: A | Discharge status: Home / Self Care | Payer: No Typology Code available for payment source | Attending: Emergency Medicine | Admitting: Emergency Medicine

## 2021-10-30 ENCOUNTER — Other Ambulatory Visit: Payer: Self-pay

## 2021-10-30 DIAGNOSIS — R42 Dizziness and giddiness: Secondary | ICD-10-CM | POA: Insufficient documentation

## 2021-10-30 DIAGNOSIS — R55 Syncope and collapse: Secondary | ICD-10-CM | POA: Insufficient documentation

## 2021-10-30 LAB — CBG MONITORING, ED: Glucose-Capillary: 104 mg/dL — ABNORMAL HIGH (ref 70–99)

## 2021-10-30 NOTE — ED Notes (Signed)
Pt in bed, pt states that he is ready to go home, pt requests a work note, md notified, work note given.

## 2021-10-30 NOTE — ED Provider Notes (Signed)
[facility]-EMERGENCY DEPT Provider Note   CSN: [id number] Arrival date & time: 10/30/21  4627     History  Chief Complaint  Patient presents with   Loss of Consciousness    Jacob Johnston is a 31 y.o. male.  31 year old male who presents after having syncopal event today while bending over.  This is patient's third episode of having positional syncope.  States he gets dizzy and lightheaded when he makes certain movements with his head.  No associated headache.  No chest or abdominal discomfort.  No recent history of volume loss.  No medications.  Patient states that he hydrates with ginger ale, water, beer.  States he drinks about a 40 ounce of beer a day.  States he feels back to his baseline at this time.      Home Medications Prior to Admission medications   Medication Sig Start Date End Date Taking? Authorizing Provider  ibuprofen (ADVIL,MOTRIN) 800 MG tablet Take 1 tablet (800 mg total) by mouth 3 (three) times daily. 01/03/18   Georgetta Haber, NP  loperamide (IMODIUM) 2 MG capsule Take 1 capsule (2 mg total) by mouth 4 (four) times daily as needed for diarrhea or loose stools. 12/02/16   Horton, Mayer Masker, MD  ondansetron (ZOFRAN ODT) 4 MG disintegrating tablet Take 1 tablet (4 mg total) by mouth every 8 (eight) hours as needed for nausea or vomiting. 12/02/16   Horton, Mayer Masker, MD  traMADol (ULTRAM) 50 MG tablet Take 1 tablet (50 mg total) by mouth every 6 (six) hours as needed. 01/03/18   Georgetta Haber, NP      Allergies    Patient has no known allergies.    Review of Systems   Review of Systems  All other systems reviewed and are negative.  Physical Exam Updated Vital Signs BP 121/75 (BP Location: Right Arm)    Pulse 62    Temp 98 F (36.7 C) (Oral)    Resp 14    Ht 1.854 m (6\' 1" )    Wt 68 kg    SpO2 99%    BMI 19.79 kg/m  Physical Exam Vitals and nursing note reviewed.  Constitutional:      General: He is not in acute distress.     Appearance: Normal appearance. He is well-developed. He is not toxic-appearing.  HENT:     Head: Normocephalic and atraumatic.  Eyes:     General: Lids are normal.     Conjunctiva/sclera: Conjunctivae normal.     Pupils: Pupils are equal, round, and reactive to light.  Neck:     Thyroid: No thyroid mass.     Trachea: No tracheal deviation.  Cardiovascular:     Rate and Rhythm: Normal rate and regular rhythm.     Heart sounds: Normal heart sounds. No murmur heard.   No gallop.  Pulmonary:     Effort: Pulmonary effort is normal. No respiratory distress.     Breath sounds: Normal breath sounds. No stridor. No decreased breath sounds, wheezing, rhonchi or rales.  Abdominal:     General: There is no distension.     Palpations: Abdomen is soft.     Tenderness: There is no abdominal tenderness. There is no rebound.  Musculoskeletal:        General: No tenderness. Normal range of motion.     Cervical back: Normal range of motion and neck supple.  Skin:    General: Skin is warm and dry.  Findings: No abrasion or rash.  Neurological:     Mental Status: He is alert and oriented to person, place, and time. Mental status is at baseline.     GCS: GCS eye subscore is 4. GCS verbal subscore is 5. GCS motor subscore is 6.     Cranial Nerves: No cranial nerve deficit.     Sensory: No sensory deficit.     Motor: Motor function is intact.  Psychiatric:        Attention and Perception: Attention normal.        Speech: Speech normal.        Behavior: Behavior normal.    ED Results / Procedures / Treatments   Labs (all labs ordered are listed, but only abnormal results are displayed) Labs Reviewed  CBG MONITORING, ED - Abnormal; Notable for the following components:      Result Value   Glucose-Capillary 104 (*)    All other components within normal limits    EKG EKG Interpretation  Date/Time:  Tuesday October 30 2021 07:17:14 EST Ventricular Rate:  63 PR Interval:  174 QRS  Duration: 88 QT Interval:  387 QTC Calculation: 397 R Axis:   95 Text Interpretation: Sinus rhythm Borderline right axis deviation Confirmed by Lorre Nick ([id number]) on 10/30/2021 7:56:03 AM  Radiology No results found.  Procedures Procedures    Medications Ordered in ED Medications - No data to display  ED Course/ Medical Decision Making/ A&P                           Medical Decision Making Amount and/or Complexity of Data Reviewed ECG/medicine tests: ordered.   Patient is not orthostatic here.  EKG per my interpretation shows no ischemia or prolonged QT syndrome.  Patient likely with repeated vagal episodes.  Was seen recently for similar episode a few days ago and that work-up was reviewed.  No indication for repeat lab testing at this time.  Patient does not require admission.  No concern for ACS at this time.  Patient to be given referral to cardiology on-call return precautions given       Final Clinical Impression(s) / ED Diagnoses Final diagnoses:  None    Rx / DC Orders ED Discharge Orders     None         Lorre Nick, MD 10/30/21 [phone]
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
## 2021-10-30 NOTE — ED Notes (Signed)
Pt reports no dizziness or being lightheaded while doing ortho vitals

## 2021-10-31 ENCOUNTER — Other Ambulatory Visit: Payer: Self-pay

## 2021-10-31 ENCOUNTER — Emergency Department (HOSPITAL_BASED_OUTPATIENT_CLINIC_OR_DEPARTMENT_OTHER)
Admission: EM | Admit: 2021-10-31 | Discharge: 2021-10-31 | Payer: No Typology Code available for payment source | Source: Home / Self Care

## 2022-01-17 IMAGING — CT CT HEAD W/O CM
3 series · 16 of 47 positions shown, 19 images · non-contrast
Comparison: None.

CLINICAL DATA: Syncope/presyncope, cerebrovascular cause suspected



[Series 4: head wo · axial · 0.47mm/px · z∈[-142,-17]mm · 10 of 31 slices shown, 13 images]
[im 3/31  brain]
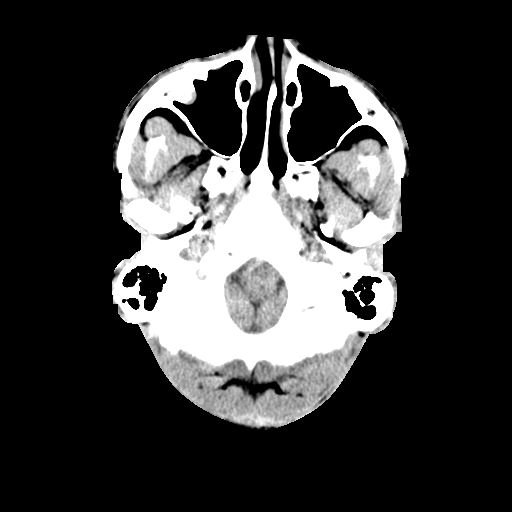
[im 3/31  bone]
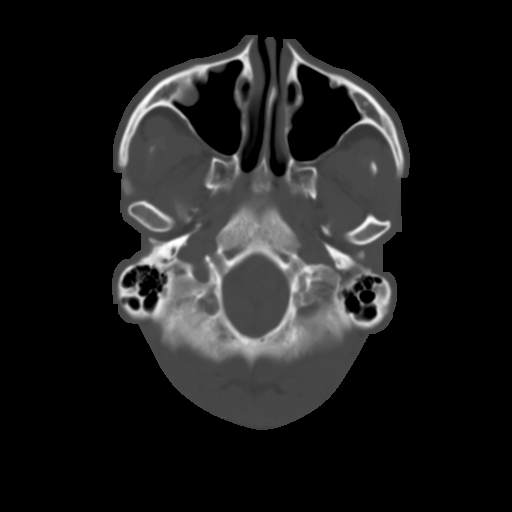
[im 6/31  brain]
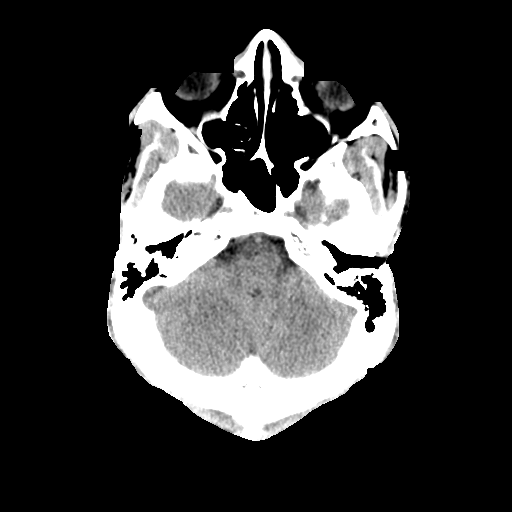
[im 9/31  brain]
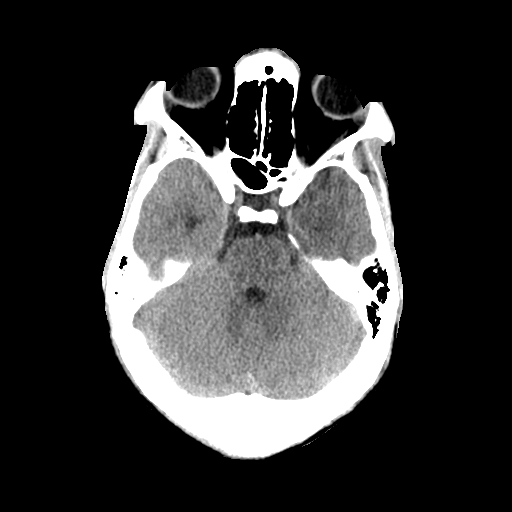
[im 11/31  brain]
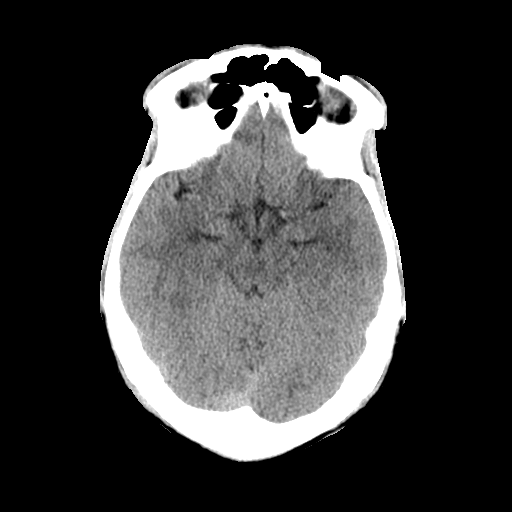
[im 14/31  brain]
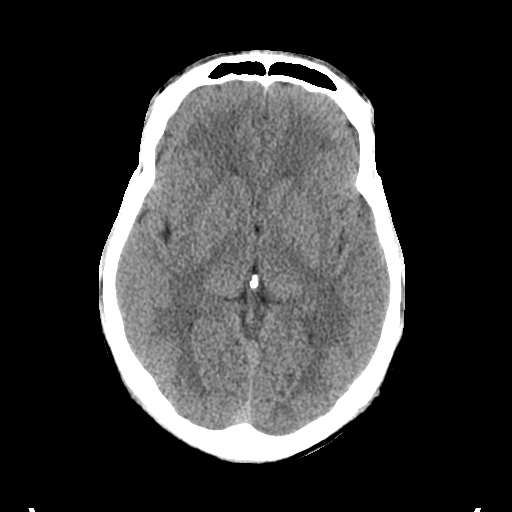
[im 14/31  bone]
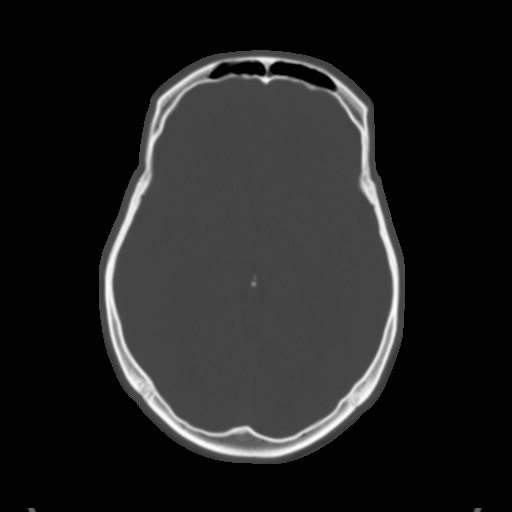
[im 17/31  brain]
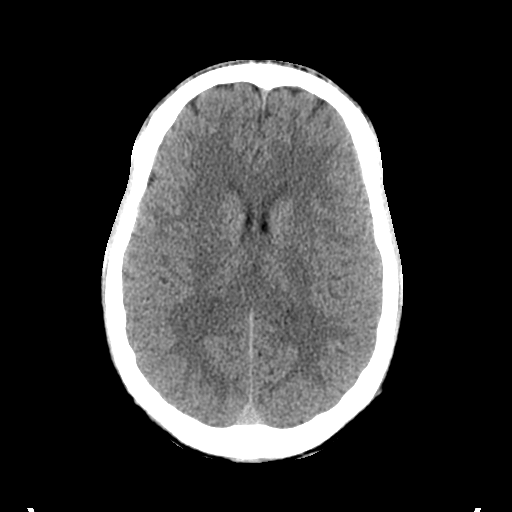
[im 20/31  brain]
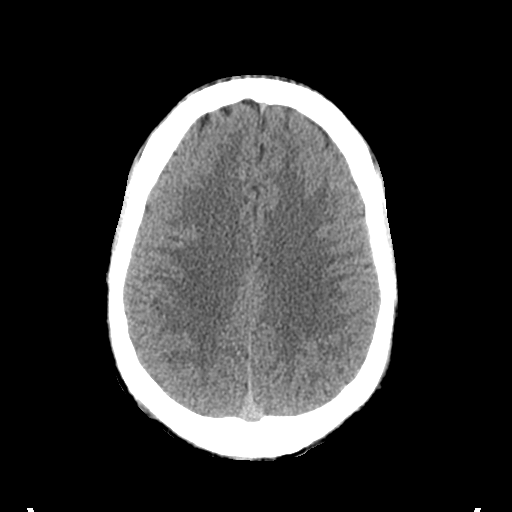
[im 23/31  brain]
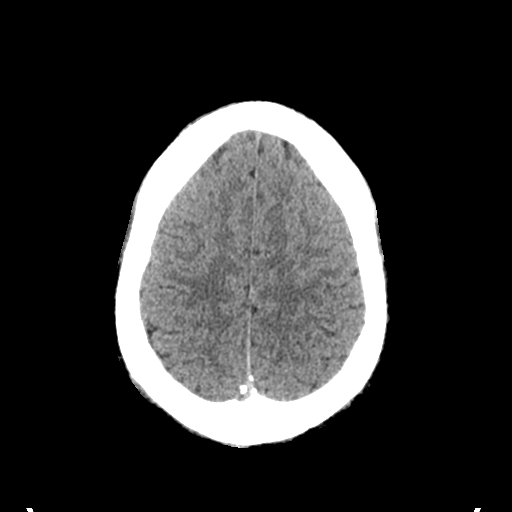
[im 25/31  brain]
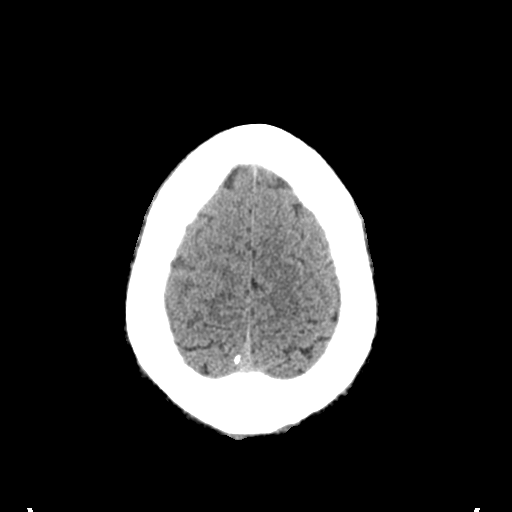
[im 25/31  bone]
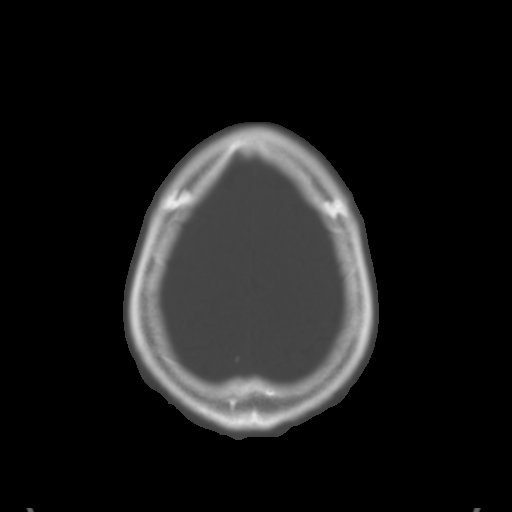
[im 28/31  brain]
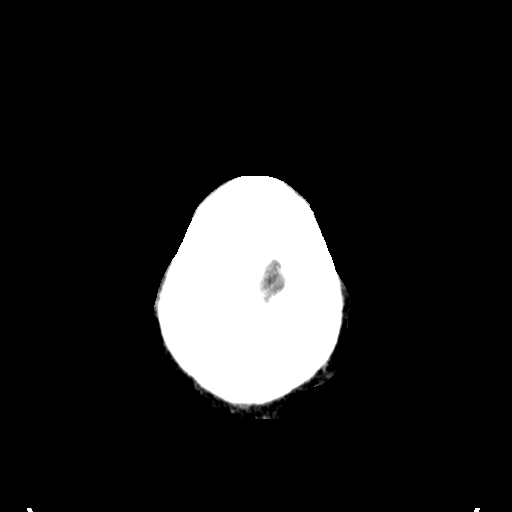

[Series 5: coronal soft tissue · coronal · 0.30mm/px · 3 of 70 slices shown]
[im 24/70  brain]
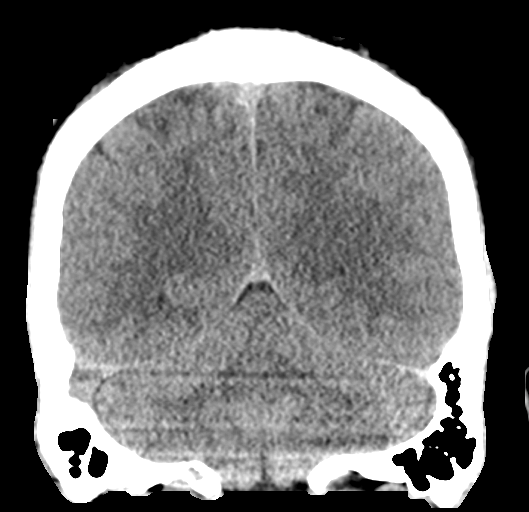
[im 31/70  brain]
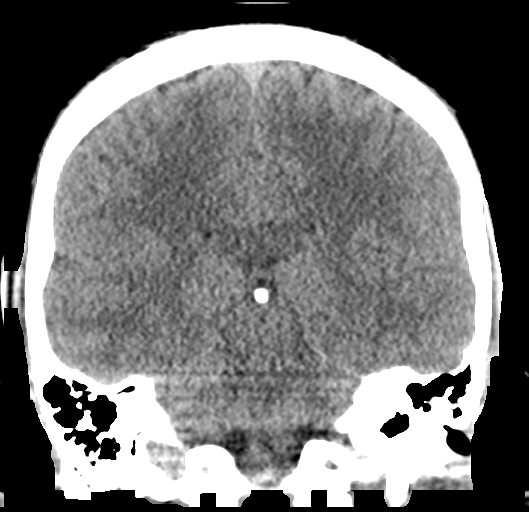
[im 39/70  brain]
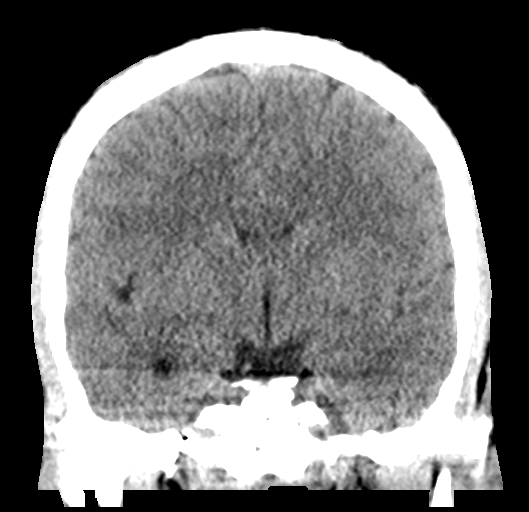

[Series 6: sagittal soft tissue · sagittal · 0.32mm/px · 3 of 51 slices shown]
[im 17/51  brain]
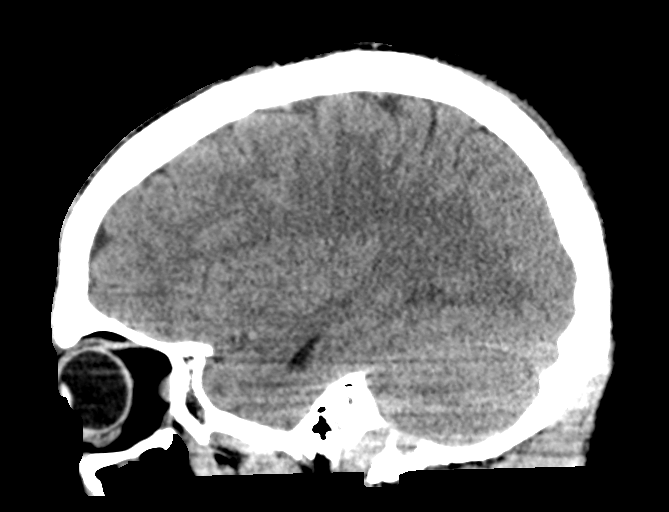
[im 26/51  brain]
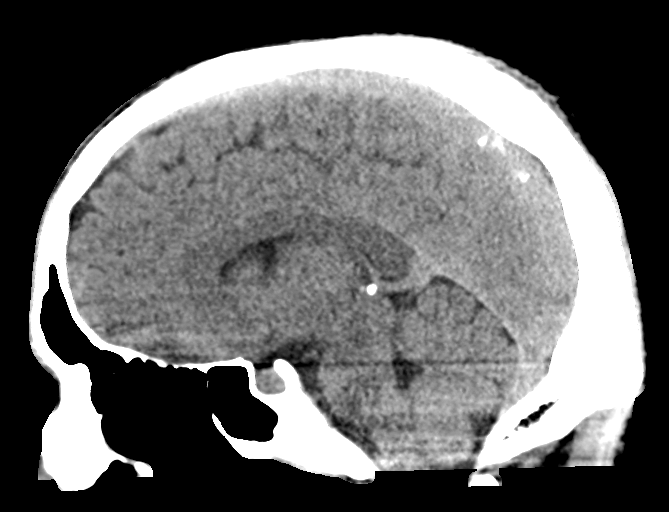
[im 34/51  brain]
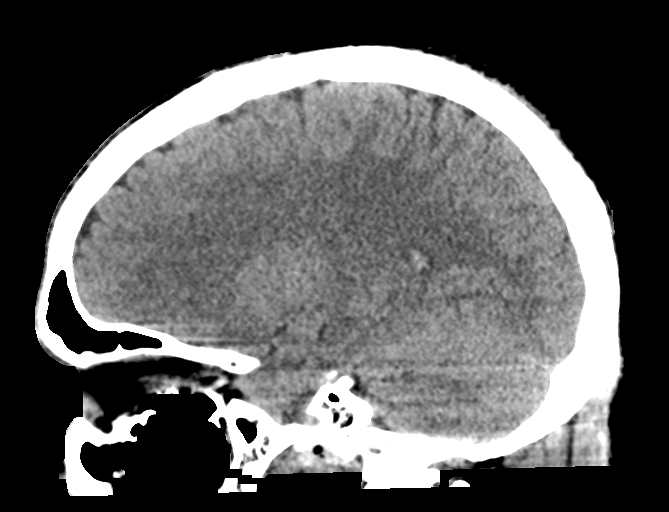

[16 of 47 positions shown; findings below may reference images not displayed]

FINDINGS: Brain: There is no acute intracranial hemorrhage, mass effect, or
edema. Gray-white differentiation is preserved. There is no
extra-axial fluid collection. Ventricles and sulci are within normal
limits in size and configuration.

Vascular: No hyperdense vessel or unexpected calcification.

Skull: Calvarium is unremarkable.

Sinuses/Orbits: Mild polypoid maxillary sinus mucosal thickening.
Orbits are unremarkable.

Other: None.
IMPRESSION: No acute intracranial abnormality.
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
## 2023-01-21 ENCOUNTER — Ambulatory Visit
Admission: EM | Admit: 2023-01-21 | Discharge: 2023-01-21 | Disposition: A | Discharge status: Home / Self Care | Payer: Medicaid Other | Attending: Family Medicine | Admitting: Family Medicine

## 2023-01-21 ENCOUNTER — Other Ambulatory Visit: Payer: Self-pay

## 2023-01-21 VITALS — BP 100/58 | HR 58 | Temp 98.1°F | Resp 16

## 2023-01-21 DIAGNOSIS — R21 Rash and other nonspecific skin eruption: Secondary | ICD-10-CM

## 2023-01-21 MED ORDER — PREDNISONE 20 MG PO TABS: 40.0000 mg | ORAL_TABLET | Freq: Every day | ORAL | 0 refills | Status: AC

## 2023-01-21 NOTE — ED Triage Notes (Signed)
Pt here for rash to torso x 3 weeks; pt denies itching

## 2023-01-21 NOTE — Discharge Instructions (Addendum)
Take prednisone 20 mg--2 daily for 3 days  We have drawn blood to check HIV and syphilis test.  Staff will notify you if there is anything positive that needs treatment

## 2023-01-21 NOTE — ED Provider Notes (Signed)
[facility]    CSN: [id number] Arrival date & time: 01/21/23  1323      History   Chief Complaint Chief Complaint  Patient presents with   Rash    HPI Jacob Johnston is a 32 y.o. male.    Rash  Here for rash that started on his chest and back and arms.  It started about 3 weeks ago.  He maybe had a Guise subjective fever with some cough and some itching in his throat and maybe a [place] itching at that time about 2 weeks ago.  The cough was brief.  He has never had a rash in his face.  No penile discharge or itching.  No vomiting or diarrhea. He states his mom is concerned he has measles.  No trouble breathing  Past Medical History:  Diagnosis Date   Heart murmur    Migraines     There are no problems to display for this patient.   History reviewed. No pertinent surgical history.     Home Medications    Prior to Admission medications   Medication Sig Start Date End Date Taking? Authorizing Provider  predniSONE (DELTASONE) 20 MG tablet Take 2 tablets (40 mg total) by mouth daily with breakfast for 3 days. 01/21/23 01/24/23 Yes Reika Callanan, Janace Aris, MD  loperamide (IMODIUM) 2 MG capsule Take 1 capsule (2 mg total) by mouth 4 (four) times daily as needed for diarrhea or loose stools. 12/02/16   Horton, Mayer Masker, MD    Family History Family History  Problem Relation Age of Onset   Healthy Mother    Healthy Father     Social History Social History   Tobacco Use   Smoking status: Former    Packs/day: .5    Types: Cigarettes   Smokeless tobacco: Never  Vaping Use   Vaping Use: Never used  Substance Use Topics   Alcohol use: Yes    Comment: 2x a week   Drug use: Yes    Types: Marijuana    Comment: daily     Allergies   Patient has no known allergies.   Review of Systems Review of Systems  Skin:  Positive for rash.     Physical Exam Triage Vital Signs ED Triage Vitals  Enc Vitals Group     BP 01/21/23 1358 (!) 100/58      Pulse Rate 01/21/23 1358 (!) 58     Resp 01/21/23 1358 16     Temp 01/21/23 1358 98.1 F (36.7 C)     Temp Source 01/21/23 1358 Oral     SpO2 01/21/23 1358 98 %     Weight --      Height --      Head Circumference --      Peak Flow --      Pain Score 01/21/23 1404 0     Pain Loc --      Pain Edu? --      Excl. in GC? --    No data found.  Updated Vital Signs BP (!) 100/58 (BP Location: Left Arm)   Pulse (!) 58   Temp 98.1 F (36.7 C) (Oral)   Resp 16   SpO2 98%   Visual Acuity Right Eye Distance:   Left Eye Distance:   Bilateral Distance:    Right Eye Near:   Left Eye Near:    Bilateral Near:     Physical Exam Vitals reviewed.  Constitutional:      General: He  is not in acute distress.    Appearance: He is not ill-appearing, toxic-appearing or diaphoretic.  HENT:     Nose: Nose normal.     Mouth/Throat:     Mouth: Mucous membranes are moist.     Pharynx: No oropharyngeal exudate or posterior oropharyngeal erythema.  Eyes:     Extraocular Movements: Extraocular movements intact.     Conjunctiva/sclera: Conjunctivae normal.     Pupils: Pupils are equal, round, and reactive to light.  Cardiovascular:     Rate and Rhythm: Normal rate and regular rhythm.     Heart sounds: No murmur heard. Pulmonary:     Effort: Pulmonary effort is normal. No respiratory distress.     Breath sounds: Normal breath sounds. No stridor. No wheezing, rhonchi or rales.  Musculoskeletal:     Cervical back: Neck supple.  Lymphadenopathy:     Cervical: No cervical adenopathy.  Skin:    Coloration: Skin is not jaundiced or pale.     Findings: Erythema: .urg.     Comments: There is a slightly raised rash that is pink and is on his back and arms and abdomen.  Lesions are about 4 to 5 mm in size for the most part.  There is no ulceration.  There is possibly a Christmas tree pattern; the rash is not on his palms  Neurological:     General: No focal deficit present.     Mental Status: He is  alert and oriented to person, place, and time.  Psychiatric:        Behavior: Behavior normal.      UC Treatments / Results  Labs (all labs ordered are listed, but only abnormal results are displayed) Labs Reviewed  RPR  HIV ANTIBODY (ROUTINE TESTING W REFLEX)    EKG   Radiology No results found.  Procedures Procedures (including critical care time)  Medications Ordered in UC Medications - No data to display  Initial Impression / Assessment and Plan / UC Course  I have reviewed the triage vital signs and the nursing notes.  Pertinent labs & imaging results that were available during my care of the patient were reviewed by me and considered in my medical decision making (see chart for details).        Since he had a Herbold bit of a respiratory illness with some itching in his throat and maybe some itching at that time, prednisone is sent in for 3 days.  RPR and HIV testing are done today. Final Clinical Impressions(s) / UC Diagnoses   Final diagnoses:  Rash and nonspecific skin eruption     Discharge Instructions      Take prednisone 20 mg--2 daily for 3 days  We have drawn blood to check HIV and syphilis test.  Staff will notify you if there is anything positive that needs treatment       ED Prescriptions     Medication Sig Dispense Auth. Provider   predniSONE (DELTASONE) 20 MG tablet Take 2 tablets (40 mg total) by mouth daily with breakfast for 3 days. 6 tablet Marlinda Mike Janace Aris, MD      PDMP not reviewed this encounter.   Zenia Resides, MD 01/21/23 1414

## 2023-01-22 LAB — HIV ANTIBODY (ROUTINE TESTING W REFLEX): HIV Screen 4th Generation wRfx: NONREACTIVE

## 2023-01-22 LAB — RPR: RPR Ser Ql: NONREACTIVE

## 2023-01-25 ENCOUNTER — Ambulatory Visit: Payer: Medicaid Other

## 2023-06-19 ENCOUNTER — Emergency Department (HOSPITAL_COMMUNITY)
Admission: EM | Admit: 2023-06-19 | Discharge: 2023-06-19 | Disposition: A | Discharge status: Home / Self Care | Payer: Medicaid Other | Attending: Emergency Medicine | Admitting: Emergency Medicine

## 2023-06-19 ENCOUNTER — Other Ambulatory Visit: Payer: Self-pay

## 2023-06-19 ENCOUNTER — Encounter (HOSPITAL_COMMUNITY): Payer: Self-pay

## 2023-06-19 DIAGNOSIS — R002 Palpitations: Secondary | ICD-10-CM

## 2023-06-19 DIAGNOSIS — R001 Bradycardia, unspecified: Secondary | ICD-10-CM | POA: Diagnosis not present

## 2023-06-19 DIAGNOSIS — R55 Syncope and collapse: Secondary | ICD-10-CM | POA: Diagnosis not present

## 2023-06-19 DIAGNOSIS — R42 Dizziness and giddiness: Secondary | ICD-10-CM

## 2023-06-19 LAB — CBC WITH DIFFERENTIAL/PLATELET
Abs Immature Granulocytes: 0 10*3/uL (ref 0.00–0.07)
Basophils Absolute: 0 10*3/uL (ref 0.0–0.1)
Basophils Relative: 0 %
Eosinophils Absolute: 0 10*3/uL (ref 0.0–0.5)
Eosinophils Relative: 1 %
HCT: 38.9 % — ABNORMAL LOW (ref 39.0–52.0)
Hemoglobin: 12.6 g/dL — ABNORMAL LOW (ref 13.0–17.0)
Immature Granulocytes: 0 %
Lymphocytes Relative: 52 %
Lymphs Abs: 1.7 10*3/uL (ref 0.7–4.0)
MCH: 30.8 pg (ref 26.0–34.0)
MCHC: 32.4 g/dL (ref 30.0–36.0)
MCV: 95.1 fL (ref 80.0–100.0)
Monocytes Absolute: 0.3 10*3/uL (ref 0.1–1.0)
Monocytes Relative: 9 %
Neutro Abs: 1.3 10*3/uL — ABNORMAL LOW (ref 1.7–7.7)
Neutrophils Relative %: 38 %
Platelets: 164 10*3/uL (ref 150–400)
RBC: 4.09 MIL/uL — ABNORMAL LOW (ref 4.22–5.81)
RDW: 13.6 % (ref 11.5–15.5)
WBC: 3.3 10*3/uL — ABNORMAL LOW (ref 4.0–10.5)
nRBC: 0 % (ref 0.0–0.2)

## 2023-06-19 LAB — BASIC METABOLIC PANEL
Anion gap: 8 (ref 5–15)
BUN: 8 mg/dL (ref 6–20)
CO2: 26 mmol/L (ref 22–32)
Calcium: 8.7 mg/dL — ABNORMAL LOW (ref 8.9–10.3)
Chloride: 104 mmol/L (ref 98–111)
Creatinine, Ser: 1.02 mg/dL (ref 0.61–1.24)
GFR, Estimated: >60 mL/min (ref >60)
Glucose, Bld: 88 mg/dL (ref 70–99)
Potassium: 3.7 mmol/L (ref 3.5–5.1)
Sodium: 138 mmol/L (ref 135–145)

## 2023-06-19 NOTE — Discharge Instructions (Addendum)
You were seen in the emergency room with palpitations, postural episodes of near fainting.  The workup in the emergency room is reassuring. He had requested that we pursue less aggressive workup, therefore imaging of your neck was not completed.  If you start having worsening symptoms, then you will need to return to the emergency room for additional workup.  Return precautions include persistent dizziness, severe neck pain, severe headache, acute vision change or loss.  Otherwise, follow-up with the cardiology team. Ensure you are hydrating well.
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
## 2023-08-07 NOTE — Progress Notes (Deleted)
Cardiology Office Note:    Date:  08/07/2023   ID:  Jacob Johnston, DOB [birth year], MRN [id number]  PCP:  Patient, No Pcp Per   [facility] Providers Cardiologist:  None { Click to update primary MD,subspecialty MD or APP then REFRESH:1}    Referring MD: Derwood Kaplan, MD   No chief complaint on file. ***  History of Present Illness:    Jacob Johnston is a 32 y.o. male seen at the request of DR Rhunette Croft for evaluation of palpitations.   Past Medical History:  Diagnosis Date   Heart murmur    Migraines     No past surgical history on file.  Current Medications: No outpatient medications have been marked as taking for the 08/19/23 encounter (Appointment) with [place], Shihab States M, MD.     Allergies:   Patient has no known allergies.   Social History   Socioeconomic History   Marital status: Single    Spouse name: Not on file   Number of children: Not on file   Years of education: Not on file   Highest education level: Not on file  Occupational History   Not on file  Tobacco Use   Smoking status: Former    Current packs/day: 0.50    Types: Cigarettes   Smokeless tobacco: Never  Vaping Use   Vaping status: Never Used  Substance and Sexual Activity   Alcohol use: Yes    Comment: 2x a week   Drug use: Yes    Types: Marijuana    Comment: daily   Sexual activity: Not on file  Other Topics Concern   Not on file  Social History Narrative   Not on file   Social Determinants of Health   Financial Resource Strain: Not on file  Food Insecurity: Not on file  Transportation Needs: Not on file  Physical Activity: Not on file  Stress: Not on file  Social Connections: Not on file     Family History: The patient's ***family history includes Healthy in his father and mother.  ROS:   Please see the history of present illness.    *** All other systems reviewed and are negative.  EKGs/Labs/Other Studies Reviewed:    The following studies were  reviewed today: ***      Recent Labs: 06/19/2023: BUN 8; Creatinine, Ser 1.02; Hemoglobin 12.6; Platelets 164; Potassium 3.7; Sodium 138  Recent Lipid Panel No results found for: "CHOL", "TRIG", "HDL", "CHOLHDL", "VLDL", "LDLCALC", "LDLDIRECT"   Risk Assessment/Calculations:   {Does this patient have ATRIAL FIBRILLATION?:[phone]}  No BP recorded.  {Refresh Note OR Click here to enter BP  :1}***         Physical Exam:    VS:  There were no vitals taken for this visit.    Wt Readings from Last 3 Encounters:  06/19/23 150 lb (68 kg)  10/30/21 150 lb (68 kg)  02/16/17 150 lb (68 kg)     GEN: *** Well nourished, well developed in no acute distress HEENT: Normal NECK: No JVD; No carotid bruits LYMPHATICS: No lymphadenopathy CARDIAC: ***RRR, no murmurs, rubs, gallops RESPIRATORY:  Clear to auscultation without rales, wheezing or rhonchi  ABDOMEN: Soft, non-tender, non-distended MUSCULOSKELETAL:  No edema; No deformity  SKIN: Warm and dry NEUROLOGIC:  Alert and oriented x 3 PSYCHIATRIC:  Normal affect   ASSESSMENT:    No diagnosis found. PLAN:    In order of problems listed above:  ***      {Are you ordering a  CV Procedure (e.g. stress test, cath, DCCV, TEE, etc)?   Press F2        :[id number]}    Medication Adjustments/Labs and Tests Ordered: Current medicines are reviewed at length with the patient today.  Concerns regarding medicines are outlined above.  No orders of the defined types were placed in this encounter.  No orders of the defined types were placed in this encounter.   There are no Patient Instructions on file for this visit.   Signed, Brycin Kille [place], MD  08/07/2023 12:32 PM    [facility]

## 2023-08-19 ENCOUNTER — Ambulatory Visit: Payer: Medicaid Other | Attending: Cardiology | Admitting: Cardiology

## 2023-11-06 NOTE — Progress Notes (Deleted)
  Cardiology Office Note:  .   Date:  11/06/2023  ID:  TARVARIS SLY, DOB 1991-10-01, MRN [id number] PCP: Patient, No Pcp Per  [facility] Providers Cardiologist:  None { Click to update primary MD,subspecialty MD or APP then REFRESH:1}   History of Present Illness: .   Michaeel Furnish [place] is a 33 y.o. male referred for palpitations and LH from the ED in September. EKG was unremarkable.  ROS: ***  Studies Reviewed: .        *** Risk Assessment/Calculations:   {Does this patient have ATRIAL FIBRILLATION?:[phone]} No BP recorded.  {Refresh Note OR Click here to enter BP  :1}***       Physical Exam:   VS:  There were no vitals taken for this visit.   Wt Readings from Last 3 Encounters:  06/19/23 150 lb (68 kg)  10/30/21 150 lb (68 kg)  02/16/17 150 lb (68 kg)    GEN: Well nourished, well developed in no acute distress NECK: No JVD; No carotid bruits CARDIAC: ***RRR, no murmurs, rubs, gallops RESPIRATORY:  Clear to auscultation without rales, wheezing or rhonchi  ABDOMEN: Soft, non-tender, non-distended EXTREMITIES:  No edema; No deformity   ASSESSMENT AND PLAN: .   ***    {Are you ordering a CV Procedure (e.g. stress test, cath, DCCV, TEE, etc)?   Press F2        :[id number]}  Dispo: ***  Signed, Maisie Fus, MD

## 2023-11-07 ENCOUNTER — Ambulatory Visit: Payer: Medicaid Other | Attending: Internal Medicine | Admitting: Internal Medicine

## 2023-11-13 ENCOUNTER — Encounter (HOSPITAL_BASED_OUTPATIENT_CLINIC_OR_DEPARTMENT_OTHER): Payer: Self-pay

## 2023-12-21 ENCOUNTER — Encounter (HOSPITAL_COMMUNITY): Payer: Self-pay

## 2023-12-21 ENCOUNTER — Emergency Department (HOSPITAL_COMMUNITY)

## 2023-12-21 ENCOUNTER — Emergency Department (HOSPITAL_COMMUNITY)
Admission: EM | Admit: 2023-12-21 | Discharge: 2023-12-21 | Disposition: A | Discharge status: Home / Self Care | Attending: Emergency Medicine | Admitting: Emergency Medicine

## 2023-12-21 ENCOUNTER — Other Ambulatory Visit: Payer: Self-pay

## 2023-12-21 DIAGNOSIS — N50812 Left testicular pain: Secondary | ICD-10-CM | POA: Diagnosis present

## 2023-12-21 DIAGNOSIS — R519 Headache, unspecified: Secondary | ICD-10-CM | POA: Insufficient documentation

## 2023-12-21 LAB — URINALYSIS, W/ REFLEX TO CULTURE (INFECTION SUSPECTED)
Bacteria, UA: NONE SEEN
Bilirubin Urine: NEGATIVE
Glucose, UA: NEGATIVE mg/dL
Hgb urine dipstick: NEGATIVE
Ketones, ur: NEGATIVE mg/dL
Nitrite: NEGATIVE
Protein, ur: NEGATIVE mg/dL
Specific Gravity, Urine: 1.014 (ref 1.005–1.030)
pH: 5 (ref 5.0–8.0)

## 2023-12-21 MED ORDER — KETOROLAC TROMETHAMINE 15 MG/ML IJ SOLN
15.0000 mg | Freq: Once | INTRAMUSCULAR | Status: AC
  Administered 2023-12-21: 15 mg via INTRAMUSCULAR
  Filled 2023-12-21: qty 1

## 2023-12-21 NOTE — Discharge Instructions (Addendum)
 Return for any problem.   As instructed, use ibuprofen for pain.  Use scrotal support.  Return for any problem.

## 2023-12-21 NOTE — ED Triage Notes (Signed)
 Pt presents to ED from home C/O migraine and testicle pain X 1 week.

## 2023-12-21 NOTE — ED Provider Notes (Signed)
 [place] EMERGENCY DEPARTMENT AT [facility] Provider Note   CSN: [id number] Arrival date & time: 12/21/23  0820     History  Chief Complaint  Patient presents with   Migraine   Testicle Pain    Jacob Johnston is a 33 y.o. male.  33 year old male with prior medical history as detailed below presents for evaluation.  Patient with multiple complaints.  Patient reports mild headache x 1 week.  He denies chest pain.  He denies fever.  He denies nausea or vomiting.  He also complains of mild discomfort to the left testicle.  This is also been present for 1 week.  He denies dysuria.  He denies penile discharge or drainage.  He denies concern for STD.  The history is provided by the patient and medical records.       Home Medications Prior to Admission medications   Medication Sig Start Date End Date Taking? Authorizing Provider  loperamide (IMODIUM) 2 MG capsule Take 1 capsule (2 mg total) by mouth 4 (four) times daily as needed for diarrhea or loose stools. 12/02/16   Horton, Mayer Masker, MD      Allergies    Patient has no known allergies.    Review of Systems   Review of Systems  All other systems reviewed and are negative.   Physical Exam Updated Vital Signs BP (!) 140/75 (BP Location: Left Arm)   Pulse 83   Temp 97.8 F (36.6 C) (Oral)   Resp 18   Ht 6\' 1"  (1.854 m)   Wt 68 kg   SpO2 94%   BMI 19.79 kg/m  Physical Exam Vitals and nursing note reviewed.  Constitutional:      General: He is not in acute distress.    Appearance: Normal appearance. He is well-developed.  HENT:     Head: Normocephalic and atraumatic.  Eyes:     Conjunctiva/sclera: Conjunctivae normal.     Pupils: Pupils are equal, round, and reactive to light.  Cardiovascular:     Rate and Rhythm: Normal rate and regular rhythm.     Heart sounds: Normal heart sounds.  Pulmonary:     Effort: Pulmonary effort is normal. No respiratory distress.     Breath sounds: Normal breath  sounds.  Abdominal:     General: There is no distension.     Palpations: Abdomen is soft.     Tenderness: There is no abdominal tenderness.  Genitourinary:    Penis: Normal.      Comments: Normal penis.  No penile discharge.  No lesions.  Mild tenderness to the left epididymis with palpation.  Normal lie to the left and right testicle.  Scrotum is without significant edema or erythema. Musculoskeletal:        General: No deformity. Normal range of motion.     Cervical back: Normal range of motion and neck supple.  Skin:    General: Skin is warm and dry.  Neurological:     General: No focal deficit present.     Mental Status: He is alert and oriented to person, place, and time.     ED Results / Procedures / Treatments   Labs (all labs ordered are listed, but only abnormal results are displayed) Labs Reviewed - No data to display  EKG None  Radiology No results found.  Procedures Procedures    Medications Ordered in ED Medications - No data to display  ED Course/ Medical Decision Making/ A&P  Medical Decision Making Amount and/or Complexity of Data Reviewed Radiology: ordered.  Risk Prescription drug management.    Medical Screen Complete  This patient presented to the ED with complaint of multiple complaints, testicular pain.  This complaint involves an extensive number of treatment options. The initial differential diagnosis includes, but is not limited to, testicular torsion, UTI, etc.  This presentation is: Acute, Self-Limited, Previously Undiagnosed, Uncertain Prognosis, Complicated, Systemic Symptoms, and Threat to Life/Bodily Function  Patient presented with multiple complaints but chief among them is left-sided testicular pain.  Signs and symptoms are not consistent with likely torsion.  Ultrasound confirms this with no evidence of torsion or other significant acute abnormality.  UA is without significant  abnormality.  Patient denies any issues or concerns with STD.  Patient declines treatment empirically for possible STD.  Patient is much improved after Toradol administration.  Patient understands need for close outpatient follow-up.  Strict return precautions given and understood.  Co morbidities that complicated the patient's evaluation  See HPI   Additional history obtained: External records from outside sources obtained and reviewed including prior ED visits and prior Inpatient records.    Lab Tests:  I ordered and personally interpreted labs.  The pertinent results include:  UA   Imaging Studies ordered:  I ordered imaging studies including US testicular    I agree with the radiologist interpretation.   Problem List / ED Course:  Testicular pain Disposition:  After consideration of the diagnostic results and the patients response to treatment, I feel that the patent would benefit from close outpatient followup.          Final Clinical Impression(s) / ED Diagnoses Final diagnoses:  Pain in left testicle    Rx / DC Orders ED Discharge Orders     None         Wynetta Fines, MD 12/21/23 1128

## 2023-12-22 LAB — GC/CHLAMYDIA PROBE AMP (~~LOC~~) NOT AT ARMC
Chlamydia: NEGATIVE
Comment: NEGATIVE
Comment: NORMAL
Neisseria Gonorrhea: NEGATIVE

## 2024-03-31 ENCOUNTER — Encounter (HOSPITAL_COMMUNITY): Payer: Self-pay | Admitting: Emergency Medicine

## 2024-03-31 ENCOUNTER — Emergency Department (HOSPITAL_COMMUNITY)
Admission: EM | Admit: 2024-03-31 | Discharge: 2024-03-31 | Disposition: A | Discharge status: Home / Self Care | Attending: Emergency Medicine | Admitting: Emergency Medicine

## 2024-03-31 ENCOUNTER — Emergency Department (HOSPITAL_COMMUNITY)

## 2024-03-31 ENCOUNTER — Other Ambulatory Visit: Payer: Self-pay

## 2024-03-31 DIAGNOSIS — R55 Syncope and collapse: Secondary | ICD-10-CM | POA: Insufficient documentation

## 2024-03-31 DIAGNOSIS — N492 Inflammatory disorders of scrotum: Secondary | ICD-10-CM

## 2024-03-31 DIAGNOSIS — E871 Hypo-osmolality and hyponatremia: Secondary | ICD-10-CM | POA: Diagnosis not present

## 2024-03-31 DIAGNOSIS — N454 Abscess of epididymis or testis: Secondary | ICD-10-CM | POA: Diagnosis not present

## 2024-03-31 DIAGNOSIS — N50811 Right testicular pain: Secondary | ICD-10-CM | POA: Diagnosis present

## 2024-03-31 LAB — CBC WITH DIFFERENTIAL/PLATELET
Abs Immature Granulocytes: 0.03 10*3/uL (ref 0.00–0.07)
Basophils Absolute: 0 10*3/uL (ref 0.0–0.1)
Basophils Relative: 0 %
Eosinophils Absolute: 0.1 10*3/uL (ref 0.0–0.5)
Eosinophils Relative: 1 %
HCT: 41.9 % (ref 39.0–52.0)
Hemoglobin: 13.3 g/dL (ref 13.0–17.0)
Immature Granulocytes: 0 %
Lymphocytes Relative: 23 %
Lymphs Abs: 1.9 10*3/uL (ref 0.7–4.0)
MCH: 30.2 pg (ref 26.0–34.0)
MCHC: 31.7 g/dL (ref 30.0–36.0)
MCV: 95.2 fL (ref 80.0–100.0)
Monocytes Absolute: 0.8 10*3/uL (ref 0.1–1.0)
Monocytes Relative: 10 %
Neutro Abs: 5.4 10*3/uL (ref 1.7–7.7)
Neutrophils Relative %: 66 %
Platelets: 197 10*3/uL (ref 150–400)
RBC: 4.4 MIL/uL (ref 4.22–5.81)
RDW: 13.7 % (ref 11.5–15.5)
WBC: 8.2 10*3/uL (ref 4.0–10.5)
nRBC: 0 % (ref 0.0–0.2)

## 2024-03-31 LAB — COMPREHENSIVE METABOLIC PANEL WITH GFR
ALT: 14 U/L (ref 0–44)
AST: 17 U/L (ref 15–41)
Albumin: 4.1 g/dL (ref 3.5–5.0)
Alkaline Phosphatase: 55 U/L (ref 38–126)
Anion gap: 9 (ref 5–15)
BUN: 16 mg/dL (ref 6–20)
CO2: 24 mmol/L (ref 22–32)
Calcium: 9 mg/dL (ref 8.9–10.3)
Chloride: 101 mmol/L (ref 98–111)
Creatinine, Ser: 1.02 mg/dL (ref 0.61–1.24)
GFR, Estimated: >60 mL/min (ref >60)
Glucose, Bld: 97 mg/dL (ref 70–99)
Potassium: 4.1 mmol/L (ref 3.5–5.1)
Sodium: 134 mmol/L — ABNORMAL LOW (ref 135–145)
Total Bilirubin: 0.6 mg/dL (ref 0.0–1.2)
Total Protein: 7.3 g/dL (ref 6.5–8.1)

## 2024-03-31 LAB — CBG MONITORING, ED: Glucose-Capillary: 90 mg/dL (ref 70–99)

## 2024-03-31 LAB — LIPASE, BLOOD: Lipase: 41 U/L (ref 11–51)

## 2024-03-31 MED ORDER — SODIUM CHLORIDE 0.9 % IV BOLUS
1000.0000 mL | Freq: Once | INTRAVENOUS | Status: AC
  Administered 2024-03-31: 1000 mL via INTRAVENOUS

## 2024-03-31 MED ORDER — OXYCODONE-ACETAMINOPHEN 5-325 MG PO TABS
1.0000 | ORAL_TABLET | Freq: Once | ORAL | Status: AC
  Administered 2024-03-31: 1 via ORAL
  Filled 2024-03-31: qty 1

## 2024-03-31 MED ORDER — KETOROLAC TROMETHAMINE 30 MG/ML IJ SOLN
15.0000 mg | Freq: Once | INTRAMUSCULAR | Status: AC
  Administered 2024-03-31: 15 mg via INTRAVENOUS
  Filled 2024-03-31: qty 1

## 2024-03-31 MED ORDER — OXYCODONE-ACETAMINOPHEN 5-325 MG PO TABS
1.0000 | ORAL_TABLET | Freq: Four times a day (QID) | ORAL | 0 refills | Status: AC | PRN
Start: 2024-03-31 — End: ?

## 2024-03-31 MED ORDER — DOXYCYCLINE HYCLATE 100 MG PO TABS
100.0000 mg | ORAL_TABLET | Freq: Once | ORAL | Status: AC
  Administered 2024-03-31: 100 mg via ORAL
  Filled 2024-03-31: qty 1

## 2024-03-31 MED ORDER — HYDROMORPHONE HCL 1 MG/ML IJ SOLN
1.0000 mg | Freq: Once | INTRAMUSCULAR | Status: AC
  Administered 2024-03-31: 1 mg via INTRAVENOUS
  Filled 2024-03-31: qty 1

## 2024-03-31 MED ORDER — OXYCODONE-ACETAMINOPHEN 5-325 MG PO TABS
2.0000 | ORAL_TABLET | Freq: Once | ORAL | Status: AC
  Administered 2024-03-31: 2 via ORAL
  Filled 2024-03-31: qty 2

## 2024-03-31 MED ORDER — LIDOCAINE-EPINEPHRINE 2 %-1:100000 IJ SOLN
20.0000 mL | Freq: Once | INTRAMUSCULAR | Status: AC
  Administered 2024-03-31: 20 mL
  Filled 2024-03-31: qty 1

## 2024-03-31 MED ORDER — DOXYCYCLINE HYCLATE 100 MG PO CAPS: 100.0000 mg | ORAL_CAPSULE | Freq: Two times a day (BID) | ORAL | 0 refills | Status: AC

## 2024-03-31 MED ORDER — HYDROMORPHONE HCL 1 MG/ML IJ SOLN: 1.0000 mg | Freq: Once | INTRAMUSCULAR | Status: DC

## 2024-03-31 NOTE — ED Notes (Signed)
 Pt. Abscess drained at bedside. Pt. Became sweaty and shaky after procedure. MD at bedside and gave order to do ECG for monitoring.

## 2024-03-31 NOTE — ED Notes (Signed)
 Pt approached desk and stated someone came in and told him he could leave. Pt wanted IV removed, IV removed. Last set of VS not obtained/declined. Provider aware pt left

## 2024-03-31 NOTE — ED Notes (Signed)
 Pt. Stated he became in shock after the procedure and seeing the blood and hole in his scrotum.

## 2024-03-31 NOTE — ED Provider Notes (Signed)
 [place] EMERGENCY DEPARTMENT AT [facility] Provider Note   CSN: [id number] Arrival date & time: 03/31/24  0435     Patient presents with: Testicle Pain and Groin Swelling   [name] is a 33 y.o. male.   Jacob Johnston is a 33 year old male that presents to the Emergency Department with 2 days of right sided scrotal pain. Reports pain felt on right side of scrotum when he was cleaning himself in the shower that he describes as sharp, needle like sensation, that feels hot. Pain sensation lasts around 3-5 second and comes and goes. Pain is worse with movement and radiates to his abdomen. States he tried placing heat and taking ibuprofen  with no improvement. He has some nausea but no vomiting. States he pressed on the area but caused discomfort and noted some discharge from the area. Denies any chest pain or shortness of breath. Denies any left sided scrotal pain, dysuria, discharge, hematuria, or back pain or fever. Denies trauma or recent STD.  The history is provided by the patient.  Testicle Pain Pertinent negatives include no chest pain, no abdominal pain, no headaches and no shortness of breath.       Prior to Admission medications   Medication Sig Start Date End Date Taking? Authorizing Provider  loperamide  (IMODIUM ) 2 MG capsule Take 1 capsule (2 mg total) by mouth 4 (four) times daily as needed for diarrhea or loose stools. 12/02/16   Horton, Vonzella Guernsey, MD    Allergies: Patient has no known allergies.    Review of Systems  Constitutional:  Negative for activity change, appetite change and fever.  HENT:  Negative for congestion and rhinorrhea.   Respiratory:  Negative for cough, chest tightness and shortness of breath.   Cardiovascular:  Negative for chest pain.  Gastrointestinal:  Negative for abdominal pain, nausea and vomiting.  Genitourinary:  Positive for testicular pain. Negative for dysuria and hematuria.  Musculoskeletal:  Negative for arthralgias  and myalgias.  Skin:  Negative for rash.  Neurological:  Negative for dizziness, weakness and headaches.    all other systems are negative except as noted in the HPI and PMH.   Updated Vital Signs BP 113/69 (BP Location: Left Arm)   Pulse 61   Temp 97.9 F (36.6 C) (Oral)   Resp 18   SpO2 99%   Physical Exam Vitals and nursing note reviewed.  Constitutional:      General: He is not in acute distress.    Appearance: He is well-developed.     Comments: Uncomfortable  HENT:     Head: Normocephalic and atraumatic.     Mouth/Throat:     Pharynx: No oropharyngeal exudate.   Eyes:     Conjunctiva/sclera: Conjunctivae normal.     Pupils: Pupils are equal, round, and reactive to light.   Neck:     Comments: No meningismus. Cardiovascular:     Rate and Rhythm: Normal rate and regular rhythm.     Heart sounds: Normal heart sounds. No murmur heard. Pulmonary:     Effort: Pulmonary effort is normal. No respiratory distress.     Breath sounds: Normal breath sounds.  Chest:     Chest wall: No tenderness.  Abdominal:     Palpations: Abdomen is soft.     Tenderness: There is no abdominal tenderness. There is no guarding or rebound.  Genitourinary:    Comments: 2 cm area of induration and fluctuance to superior right testicle.  Testicle itself is nontender.  Patient very uncomfortable.  No crepitus.  No surrounding cellulitis. No involvement of perineum.  Musculoskeletal:        General: No tenderness. Normal range of motion.     Cervical back: Normal range of motion and neck supple.   Skin:    General: Skin is warm.   Neurological:     General: No focal deficit present.     Mental Status: He is alert and oriented to person, place, and time. Mental status is at baseline.     Cranial Nerves: No cranial nerve deficit.     Motor: No abnormal muscle tone.     Coordination: Coordination normal.     Comments:  5/5 strength throughout. CN 2-12 intact.Equal grip strength.    Psychiatric:        Behavior: Behavior normal.     (all labs ordered are listed, but only abnormal results are displayed) Labs Reviewed  COMPREHENSIVE METABOLIC PANEL WITH GFR - Abnormal; Notable for the following components:      Result Value   Sodium 134 (*)    All other components within normal limits  CBC WITH DIFFERENTIAL/PLATELET  LIPASE, BLOOD  URINALYSIS, ROUTINE W REFLEX MICROSCOPIC    EKG: None  Radiology: US  SCROTUM W/DOPPLER Result Date: 03/31/2024 CLINICAL DATA:  2 day history of testicular pain. EXAM: SCROTAL ULTRASOUND DOPPLER ULTRASOUND OF THE TESTICLES TECHNIQUE: Complete ultrasound examination of the testicles, epididymis, and other scrotal structures was performed. Color and spectral Doppler ultrasound were also utilized to evaluate blood flow to the testicles. COMPARISON:  None Available. FINDINGS: Right testicle Measurements: 3.4 x 2.0 x 2.4 cm. No mass or microlithiasis visualized. Left testicle Measurements: 4.2 x 2.4 x 2.6 cm. No mass or microlithiasis visualized. Right epididymis:  Normal in size and appearance. Left epididymis:  Normal in size and appearance. Hydrocele:  None visualized. Varicocele:  None visualized. Pulsed Doppler interrogation of both testes demonstrates normal low resistance arterial and venous waveforms bilaterally. Other: 3.5 x 2.0 x 2.8 cm complex cystic is identified in the superficial soft tissues above the testicle towards the groin region. Speaking with the sonographer, this does not appear to represent complex fluid within a hernia sac. Patient was exquisitely tender over this region to palpation with the ultrasound probe. IMPRESSION: 1. 3.5 x 2.0 x 2.8 cm complex cystic lesion in the superficial soft tissues above the testicle towards the groin region. Patient was tender over this region to palpation with the ultrasound probe. Imaging features are nonspecific but could be related to a hematoma or abscess but given the history of tenderness  in this region, abscess is suspected. At sonography, this lesion was felt to be superficial to the inguinal canal and to not represent a groin hernia. Correlate clinically and if further anatomic delineation is warranted, CT imaging of the pelvis with contrast could be employed. 2. No evidence for testicular mass or torsion. Electronically Signed   By: Donnal Fusi M.D.   On: 03/31/2024 06:28     Procedures   Medications Ordered in the ED  HYDROmorphone  (DILAUDID ) injection 1 mg (has no administration in time range)  lidocaine-EPINEPHrine (XYLOCAINE W/EPI) 2 %-1:100000 (with pres) injection 20 mL (has no administration in time range)                                    Medical Decision Making Amount and/or Complexity of Data Reviewed Labs: ordered. Decision-making details documented in  ED Course. Radiology: ordered and independent interpretation performed. Decision-making details documented in ED Course. ECG/medicine tests: ordered and independent interpretation performed. Decision-making details documented in ED Course.  Risk Prescription drug management.   Testicular abscess.  Vitals are stable.  No distress.  No significant testicular pain.  Clinically suspect abscess.  Testicle itself appears nontender.  No fever.  No SIRS.  Ultrasound shows complex fluid collection in the right inguinal area.  Consider abscess versus hematoma.  Appears superficial to the inguinal canal.  Abdomen is soft.  Given location of abscess near spermatic cord discussed with urology Dr. Aden Agreste.  They will evaluate for likely drainage in the ED.  Did not feel that CT scan is necessary.  No evidence of a testicular torsion.Aaron Aas  Antibiotics were given.  Awaiting urology evaluation at shift change.     Final diagnoses:  Testicular abscess    ED Discharge Orders     None          Masiel Gentzler, Mara Seminole, MD 03/31/24 [phone]

## 2024-03-31 NOTE — Discharge Instructions (Addendum)
Take the antibiotics as prescribed and follow-up with the urologist.  Return to the ED with new or worsening symptoms.

## 2024-03-31 NOTE — Procedures (Signed)
   Urology Procedure Note:  Scrotal abscess, incision and drainage  Patient is a 33 year old male.  Presents to hospital with right side scrotal abscess for the last 48 hours, erupting after shaving groin in the shower.  This is the second occurrence he has had in the same context.  Please see separate consult note.  Following explanation of the procedure, consent was provided.  I disinfected the area with iodine swabs and made a circumferential block around the abscess using 7cc of lidocaine.  This was tolerated in stages with much difficulty.  After adequate time for anesthetic effect had been provided, patient was prepped and draped in the usual sterile fashion and patient no longer had sensation in the area of the abscess.  A small superficial plunge incision was made with an 11 blade.  This was carried inferiorly approximately 1 cm with an immediate irruption of purulent material.  Overall the wound was quite superficial and the area of fluctuance was roughly 2 cm across.  This was copiously irrigated with sterile water and bleeding was noted to be minimal.  I packed the wound with iodoform gauze.  This concluded the procedure.  While demonstrating this for the patient he began sweating and began to vagal down.  Of note he has had multiple syncopal episodes in the past.  He was placed on telemetry and noted to have adequate blood pressure.  ED provider was notified and presented to bedside.  He remained bradycardic but stable for short period of time.  After recovering we again reviewed the case and plan.  He understands that he will follow-up in 1 week, change his packing daily with provided supplies, and present back to the hospital with fever greater than 100.4, intractable nausea and vomiting, intractable pain, or uncontrolled bleeding.    Alla Ar, NP [facility] Pager: [phone]

## 2024-03-31 NOTE — ED Notes (Signed)
 Patient orthostatic standing and laying vitals were consistent and WNL. Sitting ones were 92/62 for the first take and 97/59 for second take. RN and MD notified.

## 2024-03-31 NOTE — ED Notes (Signed)
 Patient given urinal in the event can pee for urine sample. Patient agreeable.

## 2024-03-31 NOTE — ED Provider Notes (Addendum)
 Urology to see for abscess incision and drainage on the scrotum.  No signs of sepsis.  Patient is being treated with doxycycline.  Anticipate discharge with doxycycline. Physical Exam  BP 122/67   Pulse (!) 52   Temp 97.9 F (36.6 C) (Oral)   Resp 18   SpO2 99%   Physical Exam  Procedures  Procedures  ED Course / MDM    Medical Decision Making Amount and/or Complexity of Data Reviewed Labs: ordered. Radiology: ordered.  Risk Prescription drug management.   Urology has done consult.  They will be determining whether they can do bedside I&D and discharge or take to the OR.  No further interventions required from ED at this time.  Patient had a vasovagal episode postprocedure described by urology.  He temporarily dropped his heart rates into the 40s and became diaphoretic and lightheaded.  This resolved spontaneously.  I have rechecked the patient.  Blood pressures are normotensive, heart rate is in the mid 50s as preprocedure.  Patient reports he feels much better at this time.  Symptoms have resolved.  Patient is stable for discharge.  I will add as needed Percocet for pain control.  Urology has reviewed care plan and follow-up plan with the patient at bedside, and given information for contacting urology for any concerns or worsening condition.       Wynetta Heckle, MD 03/31/24 0102    Wynetta Heckle, MD 03/31/24 [phone]

## 2024-03-31 NOTE — ED Triage Notes (Signed)
 Patient c/o testicle pain x 2 days. Patient stated he felt his right testicle is swollen. Patient denies dysuria. Patient denies discharges. Patient report nausea denies vomiting.

## 2024-03-31 NOTE — ED Provider Notes (Signed)
 [place] [facility] EMERGENCY DEPARTMENT AT [facility] Provider Note   CSN: [id number] Arrival date & time: 03/31/24  1122     Patient presents with: Near Syncope (Pt was dc here this morning went to pharmacy to get medications, felt dizzy, weak, sweaty, and nauseated and laid down, ems called. BS88, 120/82, 100RA, 70HR. No chest pain at this time or sob, but endorses palpatations while event was going on )   Jacob Johnston is a 33 y.o. male.   Patient is a 33 year old male who was seen in the ED earlier this morning for scrotal abscess status post bedside I&D the presented to the emergency department with near syncope.  The patient Johnston that he went to the pharmacy after being discharged from the ED to pick up his prescriptions.  He Johnston while he was there he got hot, sweaty and nauseous and felt weak.  He Johnston that he squatted down and laid down.  He Johnston he felt like he was going to pass out but never completely passed out.  Denied any associated chest pain or shortness of breath.  Johnston he has not ate or drink anything today.  Johnston that he is feeling generalized weak.  Johnston that his pain is currently controlled.  He did have a similar incident occur status post his procedure in the ED prior to discharge.  The history is provided by the patient.  Near Syncope       Prior to Admission medications   Medication Sig Start Date End Date Taking? Authorizing Provider  doxycycline (VIBRAMYCIN) 100 MG capsule Take 1 capsule (100 mg total) by mouth 2 (two) times daily. 03/31/24   Rancour, Mara Seminole, MD  loperamide  (IMODIUM ) 2 MG capsule Take 1 capsule (2 mg total) by mouth 4 (four) times daily as needed for diarrhea or loose stools. 12/02/16   Horton, Vonzella Guernsey, MD  oxyCODONE -acetaminophen  (PERCOCET) 5-325 MG tablet Take 1-2 tablets by mouth every 6 (six) hours as needed. 03/31/24   Wynetta Heckle, MD    Allergies: Patient has no known allergies.    Review of Systems   Cardiovascular:  Positive for near-syncope.    Updated Vital Signs BP 111/72   Pulse (!) 51   Temp 97.9 F (36.6 C) (Oral)   Resp 10   SpO2 100%   Physical Exam Vitals and nursing note reviewed.  Constitutional:      General: He is not in acute distress.    Appearance: Normal appearance.  HENT:     Head: Normocephalic and atraumatic.     Nose: Nose normal.     Mouth/Throat:     Mouth: Mucous membranes are moist.     Pharynx: Oropharynx is clear.   Eyes:     Extraocular Movements: Extraocular movements intact.     Conjunctiva/sclera: Conjunctivae normal.    Cardiovascular:     Rate and Rhythm: Normal rate and regular rhythm.     Heart sounds: Normal heart sounds.  Pulmonary:     Effort: Pulmonary effort is normal.     Breath sounds: Normal breath sounds.  Abdominal:     General: Abdomen is flat.     Palpations: Abdomen is soft.     Tenderness: There is no abdominal tenderness.   Musculoskeletal:        General: Normal range of motion.     Cervical back: Normal range of motion.   Skin:    General: Skin is warm and dry.   Neurological:  General: No focal deficit present.     Mental Status: He is alert and oriented to person, place, and time.   Psychiatric:        Mood and Affect: Mood normal.        Behavior: Behavior normal.     (all labs ordered are listed, but only abnormal results are displayed) Labs Reviewed  CBG MONITORING, ED    EKG: EKG Interpretation Date/Time:  Wednesday March 31 2024 11:43:00 EDT Ventricular Rate:  57 PR Interval:  193 QRS Duration:  83 QT Interval:  414 QTC Calculation: 404 R Axis:   92  Text Interpretation: Sinus rhythm Borderline right axis deviation Since last tracing of earlier today No significant change was found Confirmed by Celesta Coke ([id number]) on 03/31/2024 12:08:17 PM  Radiology: US  SCROTUM W/DOPPLER Result Date: 03/31/2024 CLINICAL DATA:  2 day history of testicular pain. EXAM: SCROTAL ULTRASOUND  DOPPLER ULTRASOUND OF THE TESTICLES TECHNIQUE: Complete ultrasound examination of the testicles, epididymis, and other scrotal structures was performed. Color and spectral Doppler ultrasound were also utilized to evaluate blood flow to the testicles. COMPARISON:  None Available. FINDINGS: Right testicle Measurements: 3.4 x 2.0 x 2.4 cm. No mass or microlithiasis visualized. Left testicle Measurements: 4.2 x 2.4 x 2.6 cm. No mass or microlithiasis visualized. Right epididymis:  Normal in size and appearance. Left epididymis:  Normal in size and appearance. Hydrocele:  None visualized. Varicocele:  None visualized. Pulsed Doppler interrogation of both testes demonstrates normal low resistance arterial and venous waveforms bilaterally. Other: 3.5 x 2.0 x 2.8 cm complex cystic is identified in the superficial soft tissues above the testicle towards the groin region. Speaking with the sonographer, this does not appear to represent complex fluid within a hernia sac. Patient was exquisitely tender over this region to palpation with the ultrasound probe. IMPRESSION: 1. 3.5 x 2.0 x 2.8 cm complex cystic lesion in the superficial soft tissues above the testicle towards the groin region. Patient was tender over this region to palpation with the ultrasound probe. Imaging features are nonspecific but could be related to a hematoma or abscess but given the history of tenderness in this region, abscess is suspected. At sonography, this lesion was felt to be superficial to the inguinal canal and to not represent a groin hernia. Correlate clinically and if further anatomic delineation is warranted, CT imaging of the pelvis with contrast could be employed. 2. No evidence for testicular mass or torsion. Electronically Signed   By: Donnal Fusi M.D.   On: 03/31/2024 06:28     Procedures   Medications Ordered in the ED  sodium chloride  0.9 % bolus 1,000 mL (0 mLs Intravenous Stopped 03/31/24 1428)  oxyCODONE -acetaminophen   (PERCOCET/ROXICET) 5-325 MG per tablet 1 tablet (1 tablet Oral Given 03/31/24 1311)    Clinical Course as of 03/31/24 1433  Wed Mar 31, 2024  1358 Orthostatics negative.  [VK]  1432 Upon reassessment, patient's symptoms improved. He is stable for discharge home. Was given verbal discharge instructions and left department prior to receiving paperwork. [VK]    Clinical Course User Index [VK] Kingsley, Jaiyah Beining K, DO                                 Medical Decision Making This patient presents to the ED with chief complaint(s) of near syncope with pertinent past medical history of recent scrotal abscess s/p I&D this morning which further complicates the presenting complaint.  The complaint involves an extensive differential diagnosis and also carries with it a high risk of complications and morbidity.    The differential diagnosis includes vasovagal syncope, dehydration, electrolyte abnormality, hypo or hyperglycemia, arrhythmia, anemia, orthostatic hypotension  Additional history obtained: Additional history obtained from N/A Records reviewed recent ED records  ED Course and Reassessment: On patient's arrival he is hemodynamically stable in no acute distress.  Patient had labs performed this morning that were within normal range.  Glucose on arrival here was normal at 90.  EKG showed normal sinus rhythm without acute ischemic changes.  Patient will have orthostatics and will be given fluids, food and water and will be monitored on telemetry.  Suspect this likely vasovagal versus related to not having eaten or drank anything today and will be closely reassessed.  Independent labs interpretation:  The following labs were independently interpreted: glucose normal  Independent visualization of imaging: - N/A  Consultation: - Consulted or discussed management/test interpretation w/ external professional: N/A  Consideration for admission or further workup: Patient has no emergent conditions  requiring admission or further work-up at this time and is stable for discharge home with primary care follow-up  Social Determinants of health: N/A    Risk Prescription drug management.       Final diagnoses:  Near syncope    ED Discharge Orders     None          Nolberto Batty, [place] 03/31/24 1433

## 2024-03-31 NOTE — ED Notes (Signed)
U/S in room at this time. 

## 2024-03-31 NOTE — Consult Note (Signed)
 Urology Consult Note   Requesting Attending Physician:  Wynetta Heckle, MD Service Providing Consult: Urology  Consulting Attending: Dr. Willye Harvey   Reason for Consult:  scrotal abscess  HPI: Jacob Johnston is seen in consultation for reasons noted above at the request of Wynetta Heckle, MD. Patient is a 33 year old male presenting to [facility] following 2 days of right-sided scrotal pain.  He has had some nausea and feels sharp needlelike pain with palpation.  Scrotal ultrasound was collected noting right inguinal/scrotal abscess.  On my arrival patient was resting in bed.  He was extremely sensitive to touch and barely tolerated physical examination.  There was a notable right-sided abscess developing in the area of the right hemiscrotum/inguinal area.  We discussed the plan and he was amenable to bedside incision and drainage.   -----------------  Assessment:   33 y.o. male with right superior scrotal abscess following shaving groin in the shower.   Recommendations: # Right scrotal abscess  Bedside I&D with irrigation.  Packed with iodoform gauze.  Patient provided with supplies and understands that he should follow-up with [facility] or [facility] in clinic in approximately 1 week.  Recommend 7 days doxycycline and Bactrim.  Follow up orders placed in discharge.  Case and plan discussed with Dr. Willye Harvey  Past Medical History: Past Medical History:  Diagnosis Date   Heart murmur    Migraines     Past Surgical History:  History reviewed. No pertinent surgical history.  Medication: No current facility-administered medications for this encounter.   Current Outpatient Medications  Medication Sig Dispense Refill   doxycycline (VIBRAMYCIN) 100 MG capsule Take 1 capsule (100 mg total) by mouth 2 (two) times daily. 20 capsule 0   oxyCODONE -acetaminophen  (PERCOCET) 5-325 MG tablet Take 1-2 tablets by mouth every 6 (six) hours as needed. 20  tablet 0   loperamide  (IMODIUM ) 2 MG capsule Take 1 capsule (2 mg total) by mouth 4 (four) times daily as needed for diarrhea or loose stools. 12 capsule 0    Allergies: No Known Allergies  Social History: Social History   Tobacco Use   Smoking status: Former    Current packs/day: 0.50    Types: Cigarettes   Smokeless tobacco: Never  Vaping Use   Vaping status: Never Used  Substance Use Topics   Alcohol use: Yes    Comment: 2x a week   Drug use: Yes    Types: Marijuana    Comment: daily    Family History Family History  Problem Relation Age of Onset   Healthy Mother    Healthy Father     Review of Systems  Genitourinary:  Negative for dysuria, flank pain, frequency, hematuria and urgency.     Objective   Vital signs in last 24 hours: BP (!) 141/96   Pulse (!) 46   Temp (!) 97.5 F (36.4 C)   Resp 18   SpO2 100%   Physical Exam General: A&O, resting, appropriate HEENT: [place]/AT Pulmonary: Normal work of breathing Cardiovascular: no cyanosis Abdomen: Soft, NTTP, nondistended GU: right superficial scrotal/inguinal abscess Neuro: Appropriate, no focal neurological deficits  Most Recent Labs: Lab Results  Component Value Date   WBC 8.2 03/31/2024   HGB 13.3 03/31/2024   HCT 41.9 03/31/2024   PLT 197 03/31/2024    Lab Results  Component Value Date   NA 134 (L) 03/31/2024   K 4.1 03/31/2024   CL 101 03/31/2024   CO2 24 03/31/2024   BUN 16  03/31/2024   CREATININE 1.02 03/31/2024   CALCIUM 9.0 03/31/2024    No results found for: INR, APTT   Urine Culture: @[id number] (laburin,org,[id number],[id number])@   IMAGING: US  SCROTUM W/DOPPLER Result Date: 03/31/2024 CLINICAL DATA:  2 day history of testicular pain. EXAM: SCROTAL ULTRASOUND DOPPLER ULTRASOUND OF THE TESTICLES TECHNIQUE: Complete ultrasound examination of the testicles, epididymis, and other scrotal structures was performed. Color and spectral Doppler ultrasound were also utilized to evaluate  blood flow to the testicles. COMPARISON:  None Available. FINDINGS: Right testicle Measurements: 3.4 x 2.0 x 2.4 cm. No mass or microlithiasis visualized. Left testicle Measurements: 4.2 x 2.4 x 2.6 cm. No mass or microlithiasis visualized. Right epididymis:  Normal in size and appearance. Left epididymis:  Normal in size and appearance. Hydrocele:  None visualized. Varicocele:  None visualized. Pulsed Doppler interrogation of both testes demonstrates normal low resistance arterial and venous waveforms bilaterally. Other: 3.5 x 2.0 x 2.8 cm complex cystic is identified in the superficial soft tissues above the testicle towards the groin region. Speaking with the sonographer, this does not appear to represent complex fluid within a hernia sac. Patient was exquisitely tender over this region to palpation with the ultrasound probe. IMPRESSION: 1. 3.5 x 2.0 x 2.8 cm complex cystic lesion in the superficial soft tissues above the testicle towards the groin region. Patient was tender over this region to palpation with the ultrasound probe. Imaging features are nonspecific but could be related to a hematoma or abscess but given the history of tenderness in this region, abscess is suspected. At sonography, this lesion was felt to be superficial to the inguinal canal and to not represent a groin hernia. Correlate clinically and if further anatomic delineation is warranted, CT imaging of the pelvis with contrast could be employed. 2. No evidence for testicular mass or torsion. Electronically Signed   By: Donnal Fusi M.D.   On: 03/31/2024 06:28    ------  Alla Ar, NP Pager: [phone]   Please contact the urology consult pager with any further questions/concerns.

## 2024-04-01 ENCOUNTER — Encounter (HOSPITAL_COMMUNITY): Payer: Self-pay

## 2024-04-01 ENCOUNTER — Emergency Department (HOSPITAL_COMMUNITY)
Admission: EM | Admit: 2024-04-01 | Discharge: 2024-04-01 | Disposition: A | Discharge status: Home / Self Care | Attending: Emergency Medicine | Admitting: Emergency Medicine

## 2024-04-01 DIAGNOSIS — N492 Inflammatory disorders of scrotum: Secondary | ICD-10-CM | POA: Diagnosis present

## 2024-04-01 DIAGNOSIS — Z87891 Personal history of nicotine dependence: Secondary | ICD-10-CM | POA: Insufficient documentation

## 2024-04-01 DIAGNOSIS — L0291 Cutaneous abscess, unspecified: Secondary | ICD-10-CM

## 2024-04-01 LAB — CBC WITH DIFFERENTIAL/PLATELET
Abs Immature Granulocytes: 0.01 10*3/uL (ref 0.00–0.07)
Basophils Absolute: 0 10*3/uL (ref 0.0–0.1)
Basophils Relative: 0 %
Eosinophils Absolute: 0 10*3/uL (ref 0.0–0.5)
Eosinophils Relative: 1 %
HCT: 43.5 % (ref 39.0–52.0)
Hemoglobin: 13.7 g/dL (ref 13.0–17.0)
Immature Granulocytes: 0 %
Lymphocytes Relative: 28 %
Lymphs Abs: 1.2 10*3/uL (ref 0.7–4.0)
MCH: 31.1 pg (ref 26.0–34.0)
MCHC: 31.5 g/dL (ref 30.0–36.0)
MCV: 98.6 fL (ref 80.0–100.0)
Monocytes Absolute: 0.4 10*3/uL (ref 0.1–1.0)
Monocytes Relative: 9 %
Neutro Abs: 2.7 10*3/uL (ref 1.7–7.7)
Neutrophils Relative %: 62 %
Platelets: 175 10*3/uL (ref 150–400)
RBC: 4.41 MIL/uL (ref 4.22–5.81)
RDW: 13.7 % (ref 11.5–15.5)
WBC: 4.4 10*3/uL (ref 4.0–10.5)
nRBC: 0 % (ref 0.0–0.2)

## 2024-04-01 LAB — BASIC METABOLIC PANEL WITH GFR
Anion gap: 5 (ref 5–15)
BUN: 9 mg/dL (ref 6–20)
CO2: 25 mmol/L (ref 22–32)
Calcium: 9 mg/dL (ref 8.9–10.3)
Chloride: 108 mmol/L (ref 98–111)
Creatinine, Ser: 0.95 mg/dL (ref 0.61–1.24)
GFR, Estimated: >60 mL/min (ref >60)
Glucose, Bld: 84 mg/dL (ref 70–99)
Potassium: 4.4 mmol/L (ref 3.5–5.1)
Sodium: 138 mmol/L (ref 135–145)

## 2024-04-01 MED ORDER — PIPERACILLIN-TAZOBACTAM 3.375 G IVPB 30 MIN
3.3750 g | Freq: Once | INTRAVENOUS | Status: AC
  Administered 2024-04-01: 3.375 g via INTRAVENOUS
  Filled 2024-04-01: qty 50

## 2024-04-01 MED ORDER — SODIUM CHLORIDE 0.9 % IV SOLN: INTRAVENOUS | Status: DC

## 2024-04-01 MED ORDER — SODIUM CHLORIDE 0.9 % IV BOLUS
1000.0000 mL | Freq: Once | INTRAVENOUS | Status: AC
  Administered 2024-04-01: 1000 mL via INTRAVENOUS

## 2024-04-01 MED ORDER — HYDROMORPHONE HCL 1 MG/ML IJ SOLN
1.0000 mg | Freq: Once | INTRAMUSCULAR | Status: AC
  Administered 2024-04-01: 1 mg via INTRAVENOUS
  Filled 2024-04-01: qty 1

## 2024-04-01 MED ORDER — ONDANSETRON HCL 4 MG/2ML IJ SOLN
4.0000 mg | Freq: Once | INTRAMUSCULAR | Status: AC
  Administered 2024-04-01: 4 mg via INTRAVENOUS
  Filled 2024-04-01: qty 2

## 2024-04-01 NOTE — ED Triage Notes (Signed)
 Pt c/o scrotal pain after having an abscess drained yesterday.  Pain score 10/10.  Pt reports taking prescribed pain medication w/o relief.    Pt reports he is unable to change the dressing as well.

## 2024-04-01 NOTE — ED Provider Notes (Addendum)
 [place] EMERGENCY DEPARTMENT AT [facility] Provider Note   CSN: [id number] Arrival date & time: 04/01/24  4098     Patient presents with: Abscess   Jacob Johnston is a 33 y.o. male.   Patient status post scrotal abscess drained on the right side on June 18.  Was seen later after going to the pharmacy and had near syncopal episode and was reevaluated same day.  Patient back today stating that he is in severe pain.  Also has a migraine.  He does have a history of migraines.  Patient was discharged on doxycycline and Percocet.  Patient's vital signs are reassuring.  Temp 98.2 pulse 54 respiration 16 blood pressure 117/70.  Oxygen saturations at 100% on room air.  Patient is a former smoker.  Past medical history significant for the migraines and heart murmur.       Prior to Admission medications   Medication Sig Start Date End Date Taking? Authorizing Provider  doxycycline (VIBRAMYCIN) 100 MG capsule Take 1 capsule (100 mg total) by mouth 2 (two) times daily. 03/31/24   Rancour, Mara Seminole, MD  loperamide  (IMODIUM ) 2 MG capsule Take 1 capsule (2 mg total) by mouth 4 (four) times daily as needed for diarrhea or loose stools. 12/02/16   Horton, Vonzella Guernsey, MD  oxyCODONE -acetaminophen  (PERCOCET) 5-325 MG tablet Take 1-2 tablets by mouth every 6 (six) hours as needed. 03/31/24   Wynetta Heckle, MD    Allergies: Patient has no known allergies.    Review of Systems  Constitutional:  Negative for chills and fever.  HENT:  Negative for ear pain and sore throat.   Eyes:  Negative for pain and visual disturbance.  Respiratory:  Negative for cough and shortness of breath.   Cardiovascular:  Negative for chest pain and palpitations.  Gastrointestinal:  Negative for abdominal pain and vomiting.  Genitourinary:  Positive for scrotal swelling. Negative for dysuria and hematuria.  Musculoskeletal:  Negative for arthralgias and back pain.  Skin:  Negative for color change and rash.   Neurological:  Negative for seizures and syncope.  All other systems reviewed and are negative.   Updated Vital Signs BP 117/70 (BP Location: Left Arm)   Pulse (!) 54   Temp 98.2 F (36.8 C) (Oral)   Resp 16   Ht 1.854 m (6' 1)   Wt 68 kg   SpO2 100%   BMI 19.79 kg/m   Physical Exam Vitals and nursing note reviewed.  Constitutional:      General: He is not in acute distress.    Appearance: Normal appearance. He is well-developed.  HENT:     Head: Normocephalic and atraumatic.   Eyes:     Extraocular Movements: Extraocular movements intact.     Conjunctiva/sclera: Conjunctivae normal.     Pupils: Pupils are equal, round, and reactive to light.    Cardiovascular:     Rate and Rhythm: Normal rate and regular rhythm.     Heart sounds: No murmur heard. Pulmonary:     Effort: Pulmonary effort is normal. No respiratory distress.     Breath sounds: Normal breath sounds.  Abdominal:     Palpations: Abdomen is soft.     Tenderness: There is no abdominal tenderness.  Genitourinary:    Penis: Normal.      Comments: Drain in the right scrotum.  Still with some purulent discharge.  Swelling seems minimal.  No swelling to the left scrotal area.  Musculoskeletal:  General: No swelling.     Cervical back: Normal range of motion and neck supple.   Skin:    General: Skin is warm and dry.     Capillary Refill: Capillary refill takes less than 2 seconds.   Neurological:     General: No focal deficit present.     Mental Status: He is alert and oriented to person, place, and time.   Psychiatric:        Mood and Affect: Mood normal.     (all labs ordered are listed, but only abnormal results are displayed) Labs Reviewed  CBC WITH DIFFERENTIAL/PLATELET  BASIC METABOLIC PANEL WITH GFR    EKG: None  Radiology: US  SCROTUM W/DOPPLER Result Date: 03/31/2024 CLINICAL DATA:  2 day history of testicular pain. EXAM: SCROTAL ULTRASOUND DOPPLER ULTRASOUND OF THE TESTICLES  TECHNIQUE: Complete ultrasound examination of the testicles, epididymis, and other scrotal structures was performed. Color and spectral Doppler ultrasound were also utilized to evaluate blood flow to the testicles. COMPARISON:  None Available. FINDINGS: Right testicle Measurements: 3.4 x 2.0 x 2.4 cm. No mass or microlithiasis visualized. Left testicle Measurements: 4.2 x 2.4 x 2.6 cm. No mass or microlithiasis visualized. Right epididymis:  Normal in size and appearance. Left epididymis:  Normal in size and appearance. Hydrocele:  None visualized. Varicocele:  None visualized. Pulsed Doppler interrogation of both testes demonstrates normal low resistance arterial and venous waveforms bilaterally. Other: 3.5 x 2.0 x 2.8 cm complex cystic is identified in the superficial soft tissues above the testicle towards the groin region. Speaking with the sonographer, this does not appear to represent complex fluid within a hernia sac. Patient was exquisitely tender over this region to palpation with the ultrasound probe. IMPRESSION: 1. 3.5 x 2.0 x 2.8 cm complex cystic lesion in the superficial soft tissues above the testicle towards the groin region. Patient was tender over this region to palpation with the ultrasound probe. Imaging features are nonspecific but could be related to a hematoma or abscess but given the history of tenderness in this region, abscess is suspected. At sonography, this lesion was felt to be superficial to the inguinal canal and to not represent a groin hernia. Correlate clinically and if further anatomic delineation is warranted, CT imaging of the pelvis with contrast could be employed. 2. No evidence for testicular mass or torsion. Electronically Signed   By: Donnal Fusi M.D.   On: 03/31/2024 06:28     Procedures   Medications Ordered in the ED  0.9 %  sodium chloride  infusion (has no administration in time range)  sodium chloride  0.9 % bolus 1,000 mL (has no administration in time range)   ondansetron  (ZOFRAN ) injection 4 mg (has no administration in time range)  HYDROmorphone  (DILAUDID ) injection 1 mg (has no administration in time range)                                    Medical Decision Making Amount and/or Complexity of Data Reviewed Labs: ordered.  Risk Prescription drug management.   Patient's vital signs reassuring.  Will give some pain medicine get basic labs give some IV fluids.  And reassess.  The scrotal drain is in place.  There are still some purulent discharge.  No significant swelling to the right scrotum.  At this point in time no clinical signs for deep space infection.  White blood cell count 4.4 hemoglobin 13.7 platelets 175 all reassuring.  Basic metabolic panel normal.  Patient states pain is coming back.  Will give another dose of the pain medication.  He was prescribed doxycycline and Percocet when he was last seen.  Which was on the 18th.  According the database Percocet prescription has not been filled yet.  Patient received Zosyn here to help give a jumpstart on the infection.  Scrotal exam is very reassuring.  Still some induration.  No significant scrotal swelling.  Labs without any significant abnormalities.    Final diagnoses:  Abscess    ED Discharge Orders     None          Nicklas Barns, MD 04/01/24 1610    Nicklas Barns, MD 04/01/24 1027    Nicklas Barns, MD 04/01/24 [phone]

## 2024-04-01 NOTE — Discharge Instructions (Signed)
 Continue to take your Percocet.  You can take 1 or 2 tablets.  Take your antibiotic doxycycline.  Follow-up for a wound check I would recommend getting seen again in 2 days.  Today's workup was reassuring.

## 2024-04-06 ENCOUNTER — Ambulatory Visit: Admitting: Urology

## 2024-04-06 NOTE — Progress Notes (Deleted)
   Assessment: 1. Scrotal wall abscess     Plan: ***  Chief Complaint: No chief complaint on file.   HPI: Jacob Johnston is a 33 y.o. male who presents for continued evaluation of scrotal wall abscess. He presented to the emergency room on 03/31/2024 with a 2-day history of right sided scrotal pain and swelling.  Exam and scrotal ultrasound demonstrated a right scrotal wall abscess.  He underwent incision and drainage of the abscess on 03/31/2024.  He was placed on doxycycline .   Portions of the above documentation were copied from a prior visit for review purposes only.  Allergies: No Known Allergies  PMH: Past Medical History:  Diagnosis Date   Heart murmur    Migraines     PSH: No past surgical history on file.  SH: Social History   Tobacco Use   Smoking status: Former    Current packs/day: 0.50    Types: Cigarettes   Smokeless tobacco: Never  Vaping Use   Vaping status: Never Used  Substance Use Topics   Alcohol use: Yes    Comment: 2x a week   Drug use: Yes    Types: Marijuana    Comment: daily    ROS: Constitutional:  Negative for fever, chills, weight loss CV: Negative for chest pain, previous MI, hypertension Respiratory:  Negative for shortness of breath, wheezing, sleep apnea, frequent cough GI:  Negative for nausea, vomiting, bloody stool, GERD  PE: There were no vitals taken for this visit. GENERAL APPEARANCE:  Well appearing, well developed, well nourished, NAD HEENT:  Atraumatic, normocephalic, oropharynx clear NECK:  Supple without lymphadenopathy or thyromegaly ABDOMEN:  Soft, non-tender, no masses EXTREMITIES:  Moves all extremities well, without clubbing, cyanosis, or edema NEUROLOGIC:  Alert and oriented x 3, normal gait, CN II-XII grossly intact MENTAL STATUS:  appropriate BACK:  Non-tender to palpation, No CVAT SKIN:  Warm, dry, and intact GU:     Results: None

## 2024-04-12 ENCOUNTER — Emergency Department (HOSPITAL_COMMUNITY)
Admission: EM | Admit: 2024-04-12 | Discharge: 2024-04-12 | Disposition: A | Discharge status: Home / Self Care | Attending: Emergency Medicine | Admitting: Emergency Medicine

## 2024-04-12 ENCOUNTER — Other Ambulatory Visit: Payer: Self-pay

## 2024-04-12 DIAGNOSIS — N492 Inflammatory disorders of scrotum: Secondary | ICD-10-CM | POA: Diagnosis present

## 2024-04-12 NOTE — Discharge Instructions (Addendum)
 Continue doing warm compresses.  Complete the antibiotics.  Make a follow-up appointment with [facility] if you have any ongoing symptoms.  Return to the emergency room for any worsening symptoms.

## 2024-04-12 NOTE — ED Triage Notes (Signed)
 Pt reports that the gauze in his scrotum has not fallen out like he was told it would and he is noticing some odor now. No pain, no fevers.

## 2024-04-12 NOTE — ED Provider Notes (Signed)
 [place] EMERGENCY DEPARTMENT AT [facility] Provider Note   CSN: [id number] Arrival date & time: 04/12/24  9056     Patient presents with: No chief complaint on file.   Jacob Johnston is a 33 y.o. male.   Patient is a 33 year old male who presents for recheck on a scrotal abscess.  He was seen here on June 18 and had a right sided large scrotal abscess.  This was treated with a bedside I&D by urology in the ED.  He was started on 7-day course of Bactrim and doxycycline .  He is on his last day of antibiotics today.  He said he did have an follow-up appointment with urology but was unable to make it due to a court case.  He said that he is here to have the packing removed.  He says it does have an odor to it but overall it feels like it is getting better.  He says the pain is greatly improved.  He denies any fevers.       Prior to Admission medications   Medication Sig Start Date End Date Taking? Authorizing Provider  doxycycline  (VIBRAMYCIN ) 100 MG capsule Take 1 capsule (100 mg total) by mouth 2 (two) times daily. 03/31/24   Rancour, Garnette, MD  loperamide  (IMODIUM ) 2 MG capsule Take 1 capsule (2 mg total) by mouth 4 (four) times daily as needed for diarrhea or loose stools. 12/02/16   Horton, Charmaine FALCON, MD  oxyCODONE -acetaminophen  (PERCOCET) 5-325 MG tablet Take 1-2 tablets by mouth every 6 (six) hours as needed. 03/31/24   Armenta Canning, MD    Allergies: Patient has no known allergies.    Review of Systems  Constitutional:  Negative for fever.  Gastrointestinal:  Negative for abdominal pain, nausea and vomiting.  Genitourinary:  Positive for scrotal swelling.  Skin:  Positive for wound.    Updated Vital Signs BP 105/70 (BP Location: Left Arm)   Pulse 70   Temp 98.1 F (36.7 C) (Oral)   Resp 18   SpO2 100%   Physical Exam HENT:     Head: Normocephalic and atraumatic.   Cardiovascular:     Rate and Rhythm: Normal rate.  Pulmonary:     Effort:  Pulmonary effort is normal.  Genitourinary:    Comments: With chaperone present, scrotal area was inspected.  He has packing in place to a previously I&D scrotal abscess.  This was removed.  There is minimal swelling to the area.  Minimal tenderness.  No purulent discharge.  Neurological:     Mental Status: He is alert.     (all labs ordered are listed, but only abnormal results are displayed) Labs Reviewed - No data to display  EKG: None  Radiology: No results found.   Procedures   Medications Ordered in the ED - No data to display                                  Medical Decision Making  Patient presents for recheck on his scrotal abscess.  Prior ED notes/urology notes were reviewed.  The packing was removed.  Overall appears to be doing well.  I do not see any ongoing signs of cellulitis or purulent drainage.  No fluctuant or indurated areas are present.  Appears to be doing well.  He was discharged home in good condition.  Will complete the course of antibiotics.  He lost the contact information for  [facility].  Will give him this information on his discharge papers.  Return precautions were given.     Final diagnoses:  Scrotal abscess    ED Discharge Orders     None          Lenor Hollering, MD 04/12/24 1042

## 2024-05-26 ENCOUNTER — Emergency Department (HOSPITAL_COMMUNITY)

## 2024-05-26 ENCOUNTER — Encounter (HOSPITAL_COMMUNITY): Payer: Self-pay

## 2024-05-26 ENCOUNTER — Other Ambulatory Visit: Payer: Self-pay

## 2024-05-26 ENCOUNTER — Emergency Department (HOSPITAL_COMMUNITY)
Admission: EM | Admit: 2024-05-26 | Discharge: 2024-05-26 | Disposition: A | Discharge status: Home / Self Care | Attending: Emergency Medicine | Admitting: Emergency Medicine

## 2024-05-26 DIAGNOSIS — N50811 Right testicular pain: Secondary | ICD-10-CM | POA: Diagnosis present

## 2024-05-26 DIAGNOSIS — N50812 Left testicular pain: Secondary | ICD-10-CM | POA: Diagnosis not present

## 2024-05-26 LAB — CBC WITH DIFFERENTIAL/PLATELET
Abs Immature Granulocytes: 0 K/uL (ref 0.00–0.07)
Basophils Absolute: 0 K/uL (ref 0.0–0.1)
Basophils Relative: 0 %
Eosinophils Absolute: 0 K/uL (ref 0.0–0.5)
Eosinophils Relative: 1 %
HCT: 40.9 % (ref 39.0–52.0)
Hemoglobin: 13 g/dL (ref 13.0–17.0)
Immature Granulocytes: 0 %
Lymphocytes Relative: 53 %
Lymphs Abs: 1.7 K/uL (ref 0.7–4.0)
MCH: 30.4 pg (ref 26.0–34.0)
MCHC: 31.8 g/dL (ref 30.0–36.0)
MCV: 95.6 fL (ref 80.0–100.0)
Monocytes Absolute: 0.3 K/uL (ref 0.1–1.0)
Monocytes Relative: 10 %
Neutro Abs: 1.2 K/uL — ABNORMAL LOW (ref 1.7–7.7)
Neutrophils Relative %: 36 %
Platelets: 221 K/uL (ref 150–400)
RBC: 4.28 MIL/uL (ref 4.22–5.81)
RDW: 13.4 % (ref 11.5–15.5)
WBC: 3.2 K/uL — ABNORMAL LOW (ref 4.0–10.5)
nRBC: 0 % (ref 0.0–0.2)

## 2024-05-26 LAB — BASIC METABOLIC PANEL WITH GFR
Anion gap: 8 (ref 5–15)
BUN: 8 mg/dL (ref 6–20)
CO2: 28 mmol/L (ref 22–32)
Calcium: 9.5 mg/dL (ref 8.9–10.3)
Chloride: 101 mmol/L (ref 98–111)
Creatinine, Ser: 1.07 mg/dL (ref 0.61–1.24)
GFR, Estimated: >60 mL/min (ref >60)
Glucose, Bld: 84 mg/dL (ref 70–99)
Potassium: 4.5 mmol/L (ref 3.5–5.1)
Sodium: 137 mmol/L (ref 135–145)

## 2024-05-26 LAB — URINALYSIS, ROUTINE W REFLEX MICROSCOPIC
Bilirubin Urine: NEGATIVE
Glucose, UA: NEGATIVE mg/dL
Hgb urine dipstick: NEGATIVE
Ketones, ur: NEGATIVE mg/dL
Leukocytes,Ua: NEGATIVE
Nitrite: NEGATIVE
Protein, ur: NEGATIVE mg/dL
Specific Gravity, Urine: 1.009 (ref 1.005–1.030)
pH: 8 (ref 5.0–8.0)

## 2024-05-26 MED ORDER — CIPROFLOXACIN HCL 500 MG PO TABS: 500.0000 mg | ORAL_TABLET | Freq: Two times a day (BID) | ORAL | 0 refills | Status: AC

## 2024-05-26 MED ORDER — IBUPROFEN 600 MG PO TABS: 600.0000 mg | ORAL_TABLET | Freq: Four times a day (QID) | ORAL | 0 refills | Status: AC | PRN

## 2024-05-26 MED ORDER — MORPHINE SULFATE (PF) 4 MG/ML IV SOLN
4.0000 mg | Freq: Once | INTRAVENOUS | Status: AC
  Administered 2024-05-26 (×2): 4 mg via INTRAVENOUS
  Filled 2024-05-26: qty 1

## 2024-05-26 NOTE — Discharge Instructions (Signed)
 As we discussed, please follow-up with primary care provider within 2 weeks to reevaluate you and ensure that this is resolving completely.  Otherwise, if this continues to worsen please seek reevaluation here in the emergency department.  You are ultrasound not show any signs of torsion or any other infection of the testicles or other scrotal structures.  Should he develop increased pain, nausea, vomiting, or fever, please seek reevaluation here in the emergency department.

## 2024-05-26 NOTE — ED Provider Notes (Signed)
 [place] EMERGENCY DEPARTMENT AT [facility] Provider Note   CSN: [id number] Arrival date & time: 05/26/24  0848     Patient presents with: Testicle Pain   Jacob Johnston is a 33 y.o. male who presents to the ED today with right-sided testicular pain and edema.  Previously managed for scrotal abscess in June 2025, however he feels that this pain is substantially different and that it is sharp and radiating up higher in the groin.  He denies any recent injuries and states that the pain has been ongoing for the last week and a half.  Denies any penile discharge.    Testicle Pain       Prior to Admission medications   Medication Sig Start Date End Date Taking? Authorizing Provider  ciprofloxacin  (CIPRO ) 500 MG tablet Take 1 tablet (500 mg total) by mouth every 12 (twelve) hours. 05/26/24  Yes Myriam Dorn BROCKS, PA  ibuprofen  (ADVIL ) 600 MG tablet Take 1 tablet (600 mg total) by mouth every 6 (six) hours as needed. 05/26/24  Yes Myriam Dorn BROCKS, PA  doxycycline  (VIBRAMYCIN ) 100 MG capsule Take 1 capsule (100 mg total) by mouth 2 (two) times daily. 03/31/24   Rancour, Garnette, MD  loperamide  (IMODIUM ) 2 MG capsule Take 1 capsule (2 mg total) by mouth 4 (four) times daily as needed for diarrhea or loose stools. 12/02/16   Horton, Charmaine FALCON, MD  oxyCODONE -acetaminophen  (PERCOCET) 5-325 MG tablet Take 1-2 tablets by mouth every 6 (six) hours as needed. 03/31/24   Armenta Canning, MD    Allergies: Patient has no known allergies.    Review of Systems  Genitourinary:  Positive for testicular pain.  All other systems reviewed and are negative.   Updated Vital Signs BP 115/68 (BP Location: Right Arm)   Pulse (!) 59   Temp 98.2 F (36.8 C) (Oral)   Resp 18   Ht 6' 1 (1.854 m)   Wt 68 kg   SpO2 100%   BMI 19.79 kg/m   Physical Exam Vitals and nursing note reviewed.  Constitutional:      General: He is not in acute distress.    Appearance: Normal appearance.   HENT:     Head: Normocephalic and atraumatic.     Mouth/Throat:     Mouth: Mucous membranes are moist.     Pharynx: Oropharynx is clear.  Eyes:     Extraocular Movements: Extraocular movements intact.     Conjunctiva/sclera: Conjunctivae normal.     Pupils: Pupils are equal, round, and reactive to light.  Cardiovascular:     Rate and Rhythm: Normal rate and regular rhythm.     Pulses: Normal pulses.     Heart sounds: Normal heart sounds. No murmur heard.    No friction rub. No gallop.  Pulmonary:     Effort: Pulmonary effort is normal.     Breath sounds: Normal breath sounds.  Abdominal:     General: Abdomen is flat. Bowel sounds are normal.     Palpations: Abdomen is soft.  Genitourinary:    Penis: Normal and circumcised.      Testes:        Right: Tenderness present.        Left: Tenderness present. Cremasteric reflex is absent.      Epididymis:     Right: Tenderness present.     Left: Tenderness present.  Musculoskeletal:        General: Normal range of motion.     Cervical back: Normal  range of motion and neck supple.     Right lower leg: No edema.     Left lower leg: No edema.  Skin:    General: Skin is warm and dry.     Capillary Refill: Capillary refill takes less than 2 seconds.  Neurological:     General: No focal deficit present.     Mental Status: He is alert. Mental status is at baseline.  Psychiatric:        Mood and Affect: Mood normal.     (all labs ordered are listed, but only abnormal results are displayed) Labs Reviewed  CBC WITH DIFFERENTIAL/PLATELET - Abnormal; Notable for the following components:      Result Value   WBC 3.2 (*)    Neutro Abs 1.2 (*)    All other components within normal limits  URINALYSIS, ROUTINE W REFLEX MICROSCOPIC - Abnormal; Notable for the following components:   Color, Urine STRAW (*)    All other components within normal limits  BASIC METABOLIC PANEL WITH GFR    EKG: None  Radiology: US  SCROTUM  W/DOPPLER Result Date: 05/26/2024 CLINICAL DATA:  Testicular pain. EXAM: SCROTAL ULTRASOUND DOPPLER ULTRASOUND OF THE TESTICLES TECHNIQUE: Complete ultrasound examination of the testicles, epididymis, and other scrotal structures was performed. Color and spectral Doppler ultrasound were also utilized to evaluate blood flow to the testicles. COMPARISON:  March 31, 2024. FINDINGS: Right testicle Measurements: 3.8 x 2.6 x 2.1 cm. No mass or microlithiasis visualized. Left testicle Measurements: 4.0 x 2.8 x 2.5 cm. No mass or microlithiasis visualized. Right epididymis:  Normal in size and appearance. Left epididymis:  Normal in size and appearance. Hydrocele:  None visualized. Varicocele:  None visualized. Pulsed Doppler interrogation of both testes demonstrates normal low resistance arterial and venous waveforms bilaterally. IMPRESSION: No evidence of testicular mass or torsion. Electronically Signed   By: Jacob Johnston M.D.   On: 05/26/2024 11:20     Procedures   Medications Ordered in the ED  morphine  (PF) 4 MG/ML injection 4 mg (4 mg Intravenous Given 05/26/24 1030)                                    Medical Decision Making Amount and/or Complexity of Data Reviewed Labs: ordered. Radiology: ordered.  Risk Prescription drug management.   Medical Decision Making:   Jacob Johnston is a 33 y.o. male who presented to the ED today with right testicular pain detailed above.     Complete initial physical exam performed, notably the patient  was alert and oriented in no apparent distress but visibly uncomfortable.  Physical exam does show tenderness to bilateral testes, cremasteric reflexes intact to the left however seems absent on the right..    Reviewed and confirmed nursing documentation for past medical history, family history, social history.    Initial Assessment:   With the patient's presentation of testicular pain, differential diagnosis extends from testicular torsion, epididymitis,  orchitis.   Initial Plan:  Obtain scrotal ultrasound to assess for testicular torsion Screening labs including CBC and Metabolic panel to evaluate for infectious or metabolic etiology of disease.  Urinalysis with reflex culture ordered to evaluate for UTI or relevant urologic/nephrologic pathology.  Consult urology on the suspicion for possible testicular torsion Provide pain control with IV morphine  Objective evaluation as below reviewed   Initial Study Results:   Laboratory  All laboratory results reviewed without evidence of clinically  relevant pathology.   Exceptions include: None     Radiology:  All images reviewed independently. Agree with radiology report at this time.   US  SCROTUM W/DOPPLER Result Date: 05/26/2024 CLINICAL DATA:  Testicular pain. EXAM: SCROTAL ULTRASOUND DOPPLER ULTRASOUND OF THE TESTICLES TECHNIQUE: Complete ultrasound examination of the testicles, epididymis, and other scrotal structures was performed. Color and spectral Doppler ultrasound were also utilized to evaluate blood flow to the testicles. COMPARISON:  March 31, 2024. FINDINGS: Right testicle Measurements: 3.8 x 2.6 x 2.1 cm. No mass or microlithiasis visualized. Left testicle Measurements: 4.0 x 2.8 x 2.5 cm. No mass or microlithiasis visualized. Right epididymis:  Normal in size and appearance. Left epididymis:  Normal in size and appearance. Hydrocele:  None visualized. Varicocele:  None visualized. Pulsed Doppler interrogation of both testes demonstrates normal low resistance arterial and venous waveforms bilaterally. IMPRESSION: No evidence of testicular mass or torsion. Electronically Signed   By: Jacob Johnston M.D.   On: 05/26/2024 11:20    Reassessment and Plan:   After initial exam, patient was initially seen by urology who reviewed his imaging results, at this time imaging does not support a diagnosis of testicular torsion.  Given his condition, will begin management with outpatient course of  antibiotics, as well as continued pain management outpatient.  Have patient follow-up with primary care as needed for continued management of his pain.  After consultation with urology, C. Sattenfield,NP they recommend that patient be started on outpatient antibiotic therapy regardless of UA findings as this likely may help aid in resolution of his symptoms.  Further manage his pain as previously planned.  Referral to primary care for continued following in his resolution.       Final diagnoses:  Right testicular pain    ED Discharge Orders          Ordered    ciprofloxacin  (CIPRO ) 500 MG tablet  Every 12 hours        05/26/24 1335    ibuprofen  (ADVIL ) 600 MG tablet  Every 6 hours PRN        05/26/24 1335               Myriam Dorn BROCKS, [place] 05/26/24 1336    Randol Simmonds, MD 05/27/24 1420

## 2024-05-26 NOTE — Consult Note (Signed)
 Urology Consult Note   Requesting Provider:  Dorn Dec, PA Service Providing Consult: Urology  Consulting Attending: Dr. Renda   Reason for Consult:  testicular pain  HPI: Jacob Johnston is seen in consultation for reasons noted above at the request of Dorn Dec, PA.  Patient presented to [facility] emergency department with dependent testicular pain.  This was described as being sharp in nature and radiating x 1.5 weeks.  He reports some purulent penile discharge approximately 2 weeks ago which resolved by the next day and has not returned.  I am familiar with this patient and he was treated for scrotal abscess on 03/31/2024 following a scrotal abscess developed after using a razor left in the shower.  He did experience this once remotely in the past.  I&D was performed at bedside.  He missed his follow-up appointment with [facility] due to a court case.  He arrived back in the emergency department on 6/30 for packing removal.  This should have been done daily, regardless patient appears to have healed well on physical examination today.  He reports a monogamous relationship with his significant other of 7 years.  Assessment:   33 y.o. male with testicular pain   Recommendations: # Testicular pain  Scrotal ultrasound noted appropriate blood flow bilaterally with no gas, thickening, or fluid levels.   Physical exam was unremarkable.  Urinalysis was unremarkable.  Considering his history, it would be reasonable to provide him with a week of prophylactic antibiotic coverage.  If he continues to have ongoing pain would recommend scheduling with Dr. Lovie of [facility].  Case and plan discussed with Dr. Renda  Past Medical History: Past Medical History:  Diagnosis Date   Heart murmur    Migraines     Past Surgical History:  History reviewed. No pertinent surgical history.  Medication: No current facility-administered medications for this  encounter.   Current Outpatient Medications  Medication Sig Dispense Refill   ciprofloxacin  (CIPRO ) 500 MG tablet Take 1 tablet (500 mg total) by mouth every 12 (twelve) hours. 10 tablet 0   ibuprofen  (ADVIL ) 600 MG tablet Take 1 tablet (600 mg total) by mouth every 6 (six) hours as needed. 30 tablet 0   doxycycline  (VIBRAMYCIN ) 100 MG capsule Take 1 capsule (100 mg total) by mouth 2 (two) times daily. 20 capsule 0   loperamide  (IMODIUM ) 2 MG capsule Take 1 capsule (2 mg total) by mouth 4 (four) times daily as needed for diarrhea or loose stools. 12 capsule 0   oxyCODONE -acetaminophen  (PERCOCET) 5-325 MG tablet Take 1-2 tablets by mouth every 6 (six) hours as needed. 20 tablet 0    Allergies: No Known Allergies  Social History: Social History   Tobacco Use   Smoking status: Former    Current packs/day: 0.50    Types: Cigarettes   Smokeless tobacco: Never  Vaping Use   Vaping status: Never Used  Substance Use Topics   Alcohol use: Yes    Comment: 2x a week   Drug use: Yes    Types: Marijuana    Comment: daily    Family History Family History  Problem Relation Age of Onset   Healthy Mother    Healthy Father     Review of Systems  Genitourinary:  Negative for dysuria, flank pain, frequency, hematuria and urgency.     Objective   Vital signs in last 24 hours: BP 99/69   Pulse 60   Temp 97.7 F (36.5 C) (Oral)  Resp 16   Ht 6' 1 (1.854 m)   Wt 68 kg   SpO2 100%   BMI 19.79 kg/m   Physical Exam General: A&O, resting, appropriate HEENT: [place]/AT Pulmonary: Normal work of breathing Cardiovascular: no cyanosis Abdomen: Soft, NTTP, nondistended GU: Scrotal exam was unremarkable bilaterally Neuro: Appropriate, no focal neurological deficits  Most Recent Labs: Lab Results  Component Value Date   WBC 3.2 (L) 05/26/2024   HGB 13.0 05/26/2024   HCT 40.9 05/26/2024   PLT 221 05/26/2024    Lab Results  Component Value Date   NA 137 05/26/2024   K 4.5  05/26/2024   CL 101 05/26/2024   CO2 28 05/26/2024   BUN 8 05/26/2024   CREATININE 1.07 05/26/2024   CALCIUM 9.5 05/26/2024    No results found for: INR, APTT   Urine Culture: @[id number] (laburin,org,[id number],[id number])@   IMAGING: US  SCROTUM W/DOPPLER Result Date: 05/26/2024 CLINICAL DATA:  Testicular pain. EXAM: SCROTAL ULTRASOUND DOPPLER ULTRASOUND OF THE TESTICLES TECHNIQUE: Complete ultrasound examination of the testicles, epididymis, and other scrotal structures was performed. Color and spectral Doppler ultrasound were also utilized to evaluate blood flow to the testicles. COMPARISON:  March 31, 2024. FINDINGS: Right testicle Measurements: 3.8 x 2.6 x 2.1 cm. No mass or microlithiasis visualized. Left testicle Measurements: 4.0 x 2.8 x 2.5 cm. No mass or microlithiasis visualized. Right epididymis:  Normal in size and appearance. Left epididymis:  Normal in size and appearance. Hydrocele:  None visualized. Varicocele:  None visualized. Pulsed Doppler interrogation of both testes demonstrates normal low resistance arterial and venous waveforms bilaterally. IMPRESSION: No evidence of testicular mass or torsion. Electronically Signed   By: Lynwood Landy Raddle M.D.   On: 05/26/2024 11:20    ------  [name], NP Pager: [phone]   Please contact the urology consult pager with any further questions/concerns.

## 2024-05-26 NOTE — ED Triage Notes (Signed)
 Pt presents to ED from home C/O R sided testicle pain and swelling. Pt states he had scrotal abscess in June that he had to have drained and packed, but this pain feels different. Pt states the pain is sharp, radiating to groin. Denies injury to area.

## 2024-07-01 ENCOUNTER — Other Ambulatory Visit: Payer: Self-pay

## 2024-07-01 ENCOUNTER — Encounter (HOSPITAL_COMMUNITY): Payer: Self-pay

## 2024-07-01 ENCOUNTER — Emergency Department (HOSPITAL_COMMUNITY)
Admission: EM | Admit: 2024-07-01 | Discharge: 2024-07-01 | Disposition: A | Discharge status: Home / Self Care | Attending: Emergency Medicine | Admitting: Emergency Medicine

## 2024-07-01 ENCOUNTER — Emergency Department (HOSPITAL_COMMUNITY)

## 2024-07-01 DIAGNOSIS — N50811 Right testicular pain: Secondary | ICD-10-CM | POA: Diagnosis present

## 2024-07-01 NOTE — ED Triage Notes (Signed)
 Pt presents to ED from home C/O continued testicle pain. Seen 8/13 for same, d/c with abx. Pt states, yeah, I didn't take those and it ain't getting better.

## 2024-07-01 NOTE — ED Provider Notes (Signed)
 [place] EMERGENCY DEPARTMENT AT [facility] Provider Note   CSN: [id number] Arrival date & time: 07/01/24  1101     Patient presents with: Testicle Pain   Jacob Johnston is a 33 y.o. male.   Pain with a history of known scrotal abscess.  Patient presents today stating that he feels like his right testicle is smaller than his left testicle  The history is provided by the patient and medical records. No language interpreter was used.  Testicle Pain This is a new problem. The current episode started 2 days ago. The problem occurs constantly. The problem has not changed since onset.Pertinent negatives include no chest pain, no abdominal pain and no headaches. Nothing aggravates the symptoms. Nothing relieves the symptoms. He has tried nothing for the symptoms.       Prior to Admission medications   Medication Sig Start Date End Date Taking? Authorizing Provider  ciprofloxacin  (CIPRO ) 500 MG tablet Take 1 tablet (500 mg total) by mouth every 12 (twelve) hours. 05/26/24   Myriam Dorn BROCKS, PA  doxycycline  (VIBRAMYCIN ) 100 MG capsule Take 1 capsule (100 mg total) by mouth 2 (two) times daily. 03/31/24   Rancour, Garnette, MD  ibuprofen  (ADVIL ) 600 MG tablet Take 1 tablet (600 mg total) by mouth every 6 (six) hours as needed. 05/26/24   Myriam Dorn BROCKS, PA  loperamide  (IMODIUM ) 2 MG capsule Take 1 capsule (2 mg total) by mouth 4 (four) times daily as needed for diarrhea or loose stools. 12/02/16   Horton, Charmaine FALCON, MD  oxyCODONE -acetaminophen  (PERCOCET) 5-325 MG tablet Take 1-2 tablets by mouth every 6 (six) hours as needed. 03/31/24   Armenta Canning, MD    Allergies: Patient has no known allergies.    Review of Systems  Constitutional:  Negative for appetite change and fatigue.  HENT:  Negative for congestion, ear discharge and sinus pressure.   Eyes:  Negative for discharge.  Respiratory:  Negative for cough.   Cardiovascular:  Negative for chest pain.   Gastrointestinal:  Negative for abdominal pain and diarrhea.  Genitourinary:  Positive for testicular pain. Negative for frequency and hematuria.  Musculoskeletal:  Negative for back pain.  Skin:  Negative for rash.  Neurological:  Negative for seizures and headaches.  Psychiatric/Behavioral:  Negative for hallucinations.     Updated Vital Signs BP (!) 96/59 (BP Location: Left Arm)   Pulse 62   Temp 98.2 F (36.8 C) (Oral)   Resp 16   SpO2 100%   Physical Exam Vitals and nursing note reviewed.  Constitutional:      Appearance: He is well-developed.  HENT:     Head: Normocephalic.     Nose: Nose normal.  Eyes:     General: No scleral icterus.    Conjunctiva/sclera: Conjunctivae normal.  Neck:     Thyroid: No thyromegaly.  Cardiovascular:     Rate and Rhythm: Normal rate and regular rhythm.     Heart sounds: No murmur heard.    No friction rub. No gallop.  Pulmonary:     Breath sounds: No stridor. No wheezing or rales.  Chest:     Chest wall: No tenderness.  Abdominal:     General: There is no distension.     Tenderness: There is no abdominal tenderness. There is no rebound.  Genitourinary:    Penis: Normal.      Testes: Normal.  Musculoskeletal:        General: Normal range of motion.     Cervical  back: Neck supple.  Lymphadenopathy:     Cervical: No cervical adenopathy.  Skin:    Findings: No erythema or rash.  Neurological:     Mental Status: He is alert and oriented to person, place, and time.     Motor: No abnormal muscle tone.     Coordination: Coordination normal.  Psychiatric:        Behavior: Behavior normal.     (all labs ordered are listed, but only abnormal results are displayed) Labs Reviewed - No data to display  EKG: None  Radiology: US  SCROTUM W/DOPPLER Result Date: 07/01/2024 CLINICAL DATA:  144615 Pain 144615 EXAM: SCROTAL ULTRASOUND DOPPLER ULTRASOUND OF THE TESTICLES TECHNIQUE: Complete ultrasound examination of the testicles,  epididymis, and other scrotal structures was performed. Color and spectral Doppler ultrasound were also utilized to evaluate blood flow to the testicles. COMPARISON:  05/26/2024, 03/31/2024 FINDINGS: Right testicle Measurements: 4 x 1.9 x 3.6 cm. No mass or microlithiasis visualized. Left testicle Measurements: 4 x 2.1 x 2.6 cm. No mass or microlithiasis visualized. Right epididymis:  Normal in size and appearance. Left epididymis:  Normal in size and appearance. Hydrocele:  Small left hydrocele. Varicocele:  None visualized. Pulsed Doppler interrogation of both testes demonstrates normal low resistance arterial and venous waveforms bilaterally. IMPRESSION: Small left hydrocele. Otherwise, no testicular mass, findings of epididymo-orchitis, or changes of testicular torsion, at this time. Electronically Signed   By: Rogelia Myers M.D.   On: 07/01/2024 12:34     Procedures   Medications Ordered in the ED - No data to display                                  Medical Decision Making Amount and/or Complexity of Data Reviewed Radiology: ordered.   Patient with a normal ultrasound of his scrotum.  He still thinks his right testicle is smaller.  He will follow-up with urology     Final diagnoses:  Pain in right testicle    ED Discharge Orders     None          Suzette Pac, MD 07/01/24 [phone]

## 2024-07-01 NOTE — Discharge Instructions (Signed)
 Follow-up with [facility] if you have any more questions
